# Patient Record
Sex: Female | Born: 1945 | Race: White | Hispanic: No | Smoking: Current every day smoker
Health system: Southern US, Community
[De-identification: ages and names within clinical notes are randomized; demographics above are authoritative.]

## PROBLEM LIST (undated history)

## (undated) DIAGNOSIS — F32A Depression, unspecified: Secondary | ICD-10-CM

## (undated) DIAGNOSIS — F329 Major depressive disorder, single episode, unspecified: Secondary | ICD-10-CM

## (undated) DIAGNOSIS — F419 Anxiety disorder, unspecified: Secondary | ICD-10-CM

## (undated) DIAGNOSIS — M810 Age-related osteoporosis without current pathological fracture: Secondary | ICD-10-CM

## (undated) HISTORY — PX: OTHER SURGICAL HISTORY: SHX169

## (undated) HISTORY — DX: Age-related osteoporosis without current pathological fracture: M81.0

## (undated) HISTORY — DX: Major depressive disorder, single episode, unspecified: F32.9

## (undated) HISTORY — PX: TUBAL LIGATION: SHX77

## (undated) HISTORY — DX: Anxiety disorder, unspecified: F41.9

## (undated) HISTORY — DX: Depression, unspecified: F32.A

---

## 2008-06-11 ENCOUNTER — Emergency Department (HOSPITAL_COMMUNITY): Admission: EM | Admit: 2008-06-11 | Discharge: 2008-06-12 | Payer: Self-pay | Admitting: Emergency Medicine

## 2011-04-06 ENCOUNTER — Emergency Department (HOSPITAL_COMMUNITY)
Admission: EM | Admit: 2011-04-06 | Discharge: 2011-04-06 | Disposition: A | Discharge status: Home / Self Care | Payer: 59 | Attending: Emergency Medicine | Admitting: Emergency Medicine

## 2011-04-06 ENCOUNTER — Emergency Department (HOSPITAL_COMMUNITY): Payer: 59

## 2011-04-06 DIAGNOSIS — S42213A Unspecified displaced fracture of surgical neck of unspecified humerus, initial encounter for closed fracture: Secondary | ICD-10-CM | POA: Insufficient documentation

## 2011-04-06 DIAGNOSIS — M25569 Pain in unspecified knee: Secondary | ICD-10-CM | POA: Insufficient documentation

## 2011-04-06 DIAGNOSIS — R110 Nausea: Secondary | ICD-10-CM | POA: Insufficient documentation

## 2011-04-06 DIAGNOSIS — Z87442 Personal history of urinary calculi: Secondary | ICD-10-CM | POA: Insufficient documentation

## 2011-04-06 DIAGNOSIS — M25519 Pain in unspecified shoulder: Secondary | ICD-10-CM | POA: Insufficient documentation

## 2011-04-06 DIAGNOSIS — W010XXA Fall on same level from slipping, tripping and stumbling without subsequent striking against object, initial encounter: Secondary | ICD-10-CM | POA: Insufficient documentation

## 2011-04-10 ENCOUNTER — Ambulatory Visit (HOSPITAL_COMMUNITY): Payer: 59

## 2011-04-10 ENCOUNTER — Observation Stay (HOSPITAL_COMMUNITY)
Admission: RE | Admit: 2011-04-10 | Discharge: 2011-04-11 | Disposition: A | Discharge status: Home / Self Care | Payer: 59 | Source: Ambulatory Visit | Attending: Orthopaedic Surgery | Admitting: Orthopaedic Surgery

## 2011-04-10 DIAGNOSIS — F3289 Other specified depressive episodes: Secondary | ICD-10-CM | POA: Insufficient documentation

## 2011-04-10 DIAGNOSIS — S42293A Other displaced fracture of upper end of unspecified humerus, initial encounter for closed fracture: Principal | ICD-10-CM | POA: Insufficient documentation

## 2011-04-10 DIAGNOSIS — W19XXXA Unspecified fall, initial encounter: Secondary | ICD-10-CM | POA: Insufficient documentation

## 2011-04-10 DIAGNOSIS — F329 Major depressive disorder, single episode, unspecified: Secondary | ICD-10-CM | POA: Insufficient documentation

## 2011-04-10 DIAGNOSIS — F17200 Nicotine dependence, unspecified, uncomplicated: Secondary | ICD-10-CM | POA: Insufficient documentation

## 2011-04-10 DIAGNOSIS — Z79899 Other long term (current) drug therapy: Secondary | ICD-10-CM | POA: Insufficient documentation

## 2011-04-10 DIAGNOSIS — Z01810 Encounter for preprocedural cardiovascular examination: Secondary | ICD-10-CM | POA: Insufficient documentation

## 2011-04-10 DIAGNOSIS — Z01812 Encounter for preprocedural laboratory examination: Secondary | ICD-10-CM | POA: Insufficient documentation

## 2011-04-10 LAB — CBC
Hemoglobin: 11.1 g/dL — ABNORMAL LOW (ref 12.0–15.0)
MCH: 31.8 pg (ref 26.0–34.0)
Platelets: 122 10*3/uL — ABNORMAL LOW (ref 150–400)
RBC: 3.49 MIL/uL — ABNORMAL LOW (ref 3.87–5.11)
WBC: 10.1 10*3/uL (ref 4.0–10.5)

## 2011-04-10 LAB — PROTIME-INR: INR: 1 (ref 0.00–1.49)

## 2011-04-14 NOTE — H&P (Signed)
  NAMEARYIAH, MONTEROSSO                 ACCOUNT NO.:  [id number]  MEDICAL RECORD NO.:  [id number]  LOCATION:  DAYL                         FACILITY:  [facility]  PHYSICIAN:  Vanita Panda. Magnus Ivan, M.D.DATE OF BIRTH:  04-30-1946  DATE OF ADMISSION:  04/10/2011 DATE OF DISCHARGE:                             HISTORY & PHYSICAL   CHIEF COMPLAINT:  Severe right shoulder pain.  HISTORY OF PRESENT ILLNESS:  Ms. Barbier is a 65 year old female who is right-hand dominant.  She is sustained mechanical fall this past Friday when she tripped over some bed sheets.  She sustained right three-part proximal humerus fracture.  This was diagnosed at [facility] I have seen her husband before, so they elevated to have her placed on a sling and follow up with me.  I saw her in the office yesterday.  The fracture was quite displaced and she somewhat is right-hand dominant and also takes care of elderly parents.  She wishes to proceed with open reduction and internal fixation of the injury and the fracture given the nature of the injury and the need to try to get better outcome and healing further.  The risks and benefits of this where explained to her in details.  PAST MEDICAL HISTORY:  Depression.  MEDICATIONS:  Endocet.  ALLERGIES:  No known drug allergies.  PREVIOUS SURGERY:  Tubal ligation in 1990.  FAMILY MEDICAL HISTORY:  Heart disease, breast cancer, ovarian cancer, and high blood pressure.  SOCIAL HISTORY:  She is married.  She is retired.  She does smoke a pack a day for the last 25 years.  She does not drink alcohol.  REVIEW OF SYSTEMS:  Negative for chest pain, shortness of breath, fever, chills, nausea, and vomiting.  PHYSICAL EXAMINATION:  VITAL SIGNS:  She is afebrile with stable vital signs. GENERAL:  She is alert and oriented x3, in no acute distress. HEENT:  Normocephalic, atraumatic.  Pupils equal, round, reactive to light. NECK:  Supple. LUNGS:  Clear to auscultation  bilaterally. HEART:  Regular rate and rhythm. ABDOMEN:  Benign. EXTREMITIES:  Right shoulder shows bruising, it is chronically located with distally.  She is neurovascularly intact and she has severe pain.  X-rays were reviewed and confirmed a complex right three-part proximal humerus fracture.  ASSESSMENT:  This is a 65 year old right hand dominant female with complex right proximal humerus fracture.  PLAN:  We are going to proceed to the operating room today for open reduction and internal fixation of her fracture and then admitted for overnight observation, pain control, and IV antibiotics.     Vanita Panda. Magnus Ivan, M.D.     CYB/MEDQ  D:  04/10/2011  T:  04/10/2011  Job:  [id number]  Electronically Signed by Doneen Poisson M.D. on 04/14/2011 11:46:27 AM

## 2011-04-14 NOTE — Discharge Summary (Signed)
  [name], [name]                 ACCOUNT NO.:  [id number]  MEDICAL RECORD NO.:  [id number]  LOCATION:  1618                         FACILITY:  [facility]  PHYSICIAN:  Vanita Panda. Magnus Ivan, M.D.DATE OF BIRTH:  1946-01-25  DATE OF ADMISSION:  04/10/2011 DATE OF DISCHARGE:  04/11/2011                              DISCHARGE SUMMARY   ADMITTING DIAGNOSIS:  Right shoulder four-part proximal humerus fracture.  DISCHARGE DIAGNOSIS:  Right shoulder four-part proximal humerus fracture.  PROCEDURE:  Open reduction and internal fixation of right complex four- part proximal humerus fracture on April 10, 2011.  HOSPITAL COURSE:  [name] is a 65 year old female who sustained a mechanical fall at the end of last week.  She sustained a four-part proximal humerus fracture.  She was taken to the operating room yesterday where she underwent open reduction and internal fixation of this complex fracture, which was quite difficult.  She was then admitted for overnight observation for pain control and IV antibiotics.  By postop day 1, she was certainly more comfortable and it was felt she could be discharged safely to home.  DISPOSITION:  Discharged to home.  DISCHARGE INSTRUCTIONS:  While she is at home, she can come out of her sling as comfort allows, but no overhead activities.  We will see her back in the office in 1 to 2 weeks.  She can get her incision wet, shower in 3 to 4 days as well.  DISCHARGE MEDICATIONS:  Percocet, Robaxin.     Vanita Panda. Magnus Ivan, M.D.     CYB/MEDQ  D:  04/11/2011  T:  04/11/2011  Job:  [id number]  Electronically Signed by Doneen Poisson M.D. on 04/14/2011 11:46:32 AM

## 2011-04-14 NOTE — Op Note (Signed)
[name], [name]                 ACCOUNT NO.:  [id number]  MEDICAL RECORD NO.:  [id number]  LOCATION:  1618                         FACILITY:  [facility]  PHYSICIAN:  Vanita Panda. Magnus Ivan, M.D.DATE OF BIRTH:  August 13, 1945  DATE OF PROCEDURE:  04/10/2011 DATE OF DISCHARGE:                              OPERATIVE REPORT   PREOPERATIVE DIAGNOSIS:  Right shoulder 3- to 4-part proximal humerus fracture.  POSTOPERATIVE DIAGNOSIS:  Right shoulder 4-part proximal humerus fracture.  PROCEDURE:  Open reduction and internal fixation of right shoulder proximal humerus fracture using periarticular lock and plate.  IMPLANTS:  [employer], formerly [employer], right proximal humerus lock and plate.  SURGEON:  Vanita Panda. Magnus Ivan, M.D.  ANESTHESIA: 1. General. 2. Local with 0.25% plain Sensorcaine.  BLOOD LOSS:  200 cc.  ANTIBIOTICS:  1 g IV Ancef.  COMPLICATIONS:  None.  INDICATION:  [name] is a 65 year old female, who sustained a mechanical fall recently.  She suffered a severe proximal humerus fracture subluxation in the shaft varus impact on the head.  She is right hand dominant and this is her shoulder that she does a lot of activities, especially with helping with parents who need her support and care.  We recommended she undergo an attempted open reduction internal fixation.  Given the nature of this fracture, she may end up with hemiarthroplasty at some point.  PROCEDURE DESCRIPTION:  After informed consent was obtained, the appropriate right shoulder was marked and she was brought to the operating room, placed supine on the operating table, general anesthesia was obtained.  She was then fashioned to a beach chair position with appropriate position of her head, neck, and padding down on nonoperative left arm.  Her right shoulder and arm were then prepped and draped with DuraPrep and sterile drapes including the sterile stockinette.  A time- out was called.  She was identified  as the correct patient and correct right shoulder.  I then performed a deltopectoral approach to the proximal humerus and was able to dissect down the fracture.  I found the fracture to be in multiple pieces and heavily comminuted.  I was able to get almost all fracture pieces back into alignment other than the femoral head being impacted and there is still being posterior rotation. There was a chunk of the head that I could not get that reduced.  I made a decision not to pursue with a hemiarthroplasty, but due to the major articular cartilage being intact, tried to get it as reduced as possible, placing a periarticular lock and plate.  I secured this plate on lateral cortex of the humerus and secured with locking pegs and threaded screws.  I put the shoulder to internal and external rotation and it was presently stable with no blocks to this rotation.  However, there was still a sizeable piece that I could not capture in the soft tissue.  I then copiously irrigated the tissues and closed the deep tissue with 0 Vicryl followed by 2-0 Vicryl in the subcutaneous tissue and staples on the skin.  Xeroform followed by well-padded sterile dressings were applied and the arm was placed back to sling.  She was awakened and extubated and  taken to the recovery room in stable condition.  All final counts were correct and there were no complications noted.     Vanita Panda. Magnus Ivan, M.D.     CYB/MEDQ  D:  04/10/2011  T:  04/11/2011  Job:  [id number]  Electronically Signed by Doneen Poisson M.D. on 04/14/2011 11:46:30 AM

## 2011-04-17 LAB — URINALYSIS, ROUTINE W REFLEX MICROSCOPIC
Nitrite: NEGATIVE
Urobilinogen, UA: 0.2
pH: 6

## 2011-04-17 LAB — POCT I-STAT, CHEM 8
BUN: 11
Hemoglobin: 13.6

## 2011-04-17 LAB — URINE MICROSCOPIC-ADD ON

## 2013-07-22 ENCOUNTER — Telehealth: Payer: Self-pay

## 2013-07-22 NOTE — Telephone Encounter (Signed)
Medication and allergies:  Reviewed and updated--no meds or allergies  90 day supply/mail order: na Local pharmacy: [facility] [address]   Immunizations due:  No records of any vaccines/had flu vaccine  A/P:   Added personal hx, FH, PSH as idicated (does not know of diseases in famly) No records. Has not been seen in "many, many, many years"  To Discuss with Provider: Advised patient to fast to obtain bass line labs

## 2013-07-23 ENCOUNTER — Encounter: Payer: Self-pay | Admitting: Internal Medicine

## 2013-07-23 ENCOUNTER — Ambulatory Visit (INDEPENDENT_AMBULATORY_CARE_PROVIDER_SITE_OTHER): Payer: Medicare Other | Admitting: Internal Medicine

## 2013-07-23 VITALS — BP 110/70 | HR 76 | Temp 97.8°F | Wt 133.0 lb

## 2013-07-23 DIAGNOSIS — Z13220 Encounter for screening for lipoid disorders: Secondary | ICD-10-CM

## 2013-07-23 DIAGNOSIS — Z23 Encounter for immunization: Secondary | ICD-10-CM

## 2013-07-23 DIAGNOSIS — Z136 Encounter for screening for cardiovascular disorders: Secondary | ICD-10-CM

## 2013-07-23 DIAGNOSIS — F419 Anxiety disorder, unspecified: Secondary | ICD-10-CM

## 2013-07-23 DIAGNOSIS — F329 Major depressive disorder, single episode, unspecified: Secondary | ICD-10-CM | POA: Insufficient documentation

## 2013-07-23 DIAGNOSIS — F32A Depression, unspecified: Secondary | ICD-10-CM | POA: Insufficient documentation

## 2013-07-23 DIAGNOSIS — Z0000 Encounter for general adult medical examination without abnormal findings: Secondary | ICD-10-CM | POA: Insufficient documentation

## 2013-07-23 DIAGNOSIS — F17200 Nicotine dependence, unspecified, uncomplicated: Secondary | ICD-10-CM

## 2013-07-23 DIAGNOSIS — Z130 Encounter for screening for diseases of the blood and blood-forming organs and certain disorders involving the immune mechanism: Secondary | ICD-10-CM

## 2013-07-23 DIAGNOSIS — F411 Generalized anxiety disorder: Secondary | ICD-10-CM

## 2013-07-23 HISTORY — DX: Anxiety disorder, unspecified: F41.9

## 2013-07-23 HISTORY — DX: Depression, unspecified: F32.A

## 2013-07-23 LAB — CBC WITH DIFFERENTIAL/PLATELET
BASOS ABS: 0 10*3/uL (ref 0.0–0.1)
Basophils Relative: 0.2 % (ref 0.0–3.0)
EOS ABS: 0.1 10*3/uL (ref 0.0–0.7)
EOS PCT: 1 % (ref 0.0–5.0)
HCT: 41.1 % (ref 36.0–46.0)
HEMOGLOBIN: 14 g/dL (ref 12.0–15.0)
LYMPHS PCT: 14 % (ref 12.0–46.0)
Lymphs Abs: 1.2 10*3/uL (ref 0.7–4.0)
MCHC: 34 g/dL (ref 30.0–36.0)
MCV: 93.2 fl (ref 78.0–100.0)
MONO ABS: 0.3 10*3/uL (ref 0.1–1.0)
MONOS PCT: 3.3 % (ref 3.0–12.0)
NEUTROS ABS: 6.9 10*3/uL (ref 1.4–7.7)
NEUTROS PCT: 81.5 % — AB (ref 43.0–77.0)
Platelets: 148 10*3/uL — ABNORMAL LOW (ref 150.0–400.0)
RBC: 4.42 Mil/uL (ref 3.87–5.11)
RDW: 12.8 % (ref 11.5–14.6)
WBC: 8.5 10*3/uL (ref 4.5–10.5)

## 2013-07-23 LAB — COMPREHENSIVE METABOLIC PANEL
ALT: 20 U/L (ref 0–35)
AST: 23 U/L (ref 0–37)
Albumin: 4.5 g/dL (ref 3.5–5.2)
Alkaline Phosphatase: 52 U/L (ref 39–117)
BUN: 11 mg/dL (ref 6–23)
CALCIUM: 9.4 mg/dL (ref 8.4–10.5)
CO2: 30 meq/L (ref 19–32)
Chloride: 105 mEq/L (ref 96–112)
Creatinine, Ser: 0.8 mg/dL (ref 0.4–1.2)
GFR: 80.89 mL/min (ref >60.00)
GLUCOSE: 90 mg/dL (ref 70–99)
POTASSIUM: 3.9 meq/L (ref 3.5–5.1)
Sodium: 143 mEq/L (ref 135–145)
Total Bilirubin: 0.7 mg/dL (ref 0.3–1.2)
Total Protein: 7.6 g/dL (ref 6.0–8.3)

## 2013-07-23 LAB — TSH: TSH: 0.93 u[IU]/mL (ref 0.35–5.50)

## 2013-07-23 LAB — LIPID PANEL
CHOL/HDL RATIO: 3
CHOLESTEROL: 211 mg/dL — AB (ref 0–200)
HDL: 73.9 mg/dL (ref >39.00)
TRIGLYCERIDES: 66 mg/dL (ref 0.0–149.0)
VLDL: 13.2 mg/dL (ref 0.0–40.0)

## 2013-07-23 LAB — LDL CHOLESTEROL, DIRECT: LDL DIRECT: 112.6 mg/dL

## 2013-07-23 NOTE — Patient Instructions (Signed)
  Get your blood work before you leave   Next visit is for a physical exam in 2 months , no fasting Please make an appointment     Please make an appointment with one of our counselors before you leave

## 2013-07-23 NOTE — Assessment & Plan Note (Signed)
Long history of anxiety manifested as fears and worring constantly . She did counseling many years ago and it helped significantly, has never taken medications. I recommend to restart counseling, reassess in 2 months and consider medication. She states that this feelings are significantly affecting her life; fortunately they're actually not getting worse if anything is slightly better --->  forcing herself to go out and do things

## 2013-07-23 NOTE — Assessment & Plan Note (Addendum)
The patient has not seen a doctor or having any tests since the 90s, although she is not doing a Medicare physical today we did the  following: Td today PNM shot today Had a flu shot Gynecology referral labs Refer for a mammogram Further eval on RTC  Had a Cscope for bleeding 1999 d/t rectal bleed, asx now

## 2013-07-23 NOTE — Progress Notes (Signed)
Pre visit review using our clinic review tool, if applicable. No additional management support is needed unless otherwise documented below in the visit note. 

## 2013-07-23 NOTE — Assessment & Plan Note (Signed)
Long-term smoker, no cough or shortness or breath. Risks including cancer, emphysema, CAD, strokes discussed Strategies to quit discussed as well, will call if/when ready  to take medications. rec to see her dentist

## 2013-07-23 NOTE — Progress Notes (Signed)
   Subjective:    Patient ID: [name], female    DOB: 04/04/1946, 68 y.o.   MRN: [id number]  HPI New patient, here with her husband who is one of  my patients, needs to get established, has not seen a physician or having any tests since the 90s.  Her main concern is anxiety, for the last 20 years he has developed this fear of doing things, fear when her husband is not at home, these feelings are limiting and affecting her lifestyle, she stopped driving in 8115. In the last few years she has fortunately not gotten worse actually she is forcing herself to go out and do things but by no means she feels comfortable. She denies depression per se.  She is a long-time smoker.  Past Medical History  Diagnosis Date  . Depression     h/o    Past Surgical History  Procedure Laterality Date  . Surgery      R arm and shoulder  . Tubal ligation     History   Social History  . Marital Status: Married    Spouse Name: N/A    Number of Children: 0  . Years of Education: N/A   Occupational History  . retired, catering     Social History Main Topics  . Smoking status: Current Every Day Smoker  . Smokeless tobacco: Never Used     Comment: 1 ppd started 1987  . Alcohol Use: No  . Drug Use: No  . Sexual Activity: Not on file   Other Topics Concern  . Not on file   Social History Narrative   [employer] education.   Household pt and husband    Family History  Problem Relation Age of Onset  . Breast cancer Mother     breast  . Colon cancer Neg Hx   . CAD Other     MI GF age 92  . Diabetes Father   . Ovarian cancer Other     GM    Review of Systems Denies chest pain, shortness of breath. No nausea, vomiting, diarrhea She sleeps okay, things in her life are okay, she does not have any real reason to be concerned about. Denies any cough, sputum production or wheezing.    Objective:   Physical Exam BP 110/70  Pulse 76  Temp(Src) 97.8 F (36.6 C)  Wt 133 lb (60.328  kg)  SpO2 98% General -- alert, well-developed, NAD.  Neck --  no LAD HEENT-- Not pale.  Lungs -- normal respiratory effort, no intercostal retractions, no accessory muscle use, and normal breath sounds.  Heart-- normal rate, regular rhythm, no murmur.  Abdomen-- Not distended, good bowel sounds,soft, non-tender. No rebound or rigidity. No mass,organomegaly. Extremities-- no pretibial edema bilaterally  Neurologic--  alert & oriented X3. Speech normal, gait normal, strength normal in all extremities.  Psych-- Cognition and judgment appear intact. Cooperative with normal attention span and concentration. No anxious or depressed appearing.      Assessment & Plan:  Today , I spent more than  30  min with the patient, >50% of the time counseling

## 2013-07-24 ENCOUNTER — Telehealth: Payer: Self-pay | Admitting: Internal Medicine

## 2013-07-24 DIAGNOSIS — Z1231 Encounter for screening mammogram for malignant neoplasm of breast: Secondary | ICD-10-CM

## 2013-07-24 NOTE — Telephone Encounter (Signed)
Relevant patient education assigned to patient using Emmi. ° °

## 2013-07-28 NOTE — Addendum Note (Signed)
Addended by: Peggyann Shoals on: 07/28/2013 01:26 PM   Modules accepted: Orders

## 2013-08-21 ENCOUNTER — Ambulatory Visit
Admission: RE | Admit: 2013-08-21 | Discharge: 2013-08-21 | Disposition: A | Discharge status: Home / Self Care | Payer: Medicare Other | Source: Ambulatory Visit | Attending: Internal Medicine | Admitting: Internal Medicine

## 2013-08-21 DIAGNOSIS — Z1231 Encounter for screening mammogram for malignant neoplasm of breast: Secondary | ICD-10-CM

## 2013-08-28 ENCOUNTER — Other Ambulatory Visit: Payer: Self-pay | Admitting: Internal Medicine

## 2013-08-28 DIAGNOSIS — R928 Other abnormal and inconclusive findings on diagnostic imaging of breast: Secondary | ICD-10-CM

## 2013-09-09 ENCOUNTER — Ambulatory Visit
Admission: RE | Admit: 2013-09-09 | Discharge: 2013-09-09 | Disposition: A | Discharge status: Home / Self Care | Payer: Medicare Other | Source: Ambulatory Visit | Attending: Internal Medicine | Admitting: Internal Medicine

## 2013-09-09 DIAGNOSIS — R928 Other abnormal and inconclusive findings on diagnostic imaging of breast: Secondary | ICD-10-CM

## 2013-09-18 ENCOUNTER — Telehealth: Payer: Self-pay

## 2013-09-18 NOTE — Telephone Encounter (Signed)
Patient had CPE at new patient appt

## 2013-09-18 NOTE — Telephone Encounter (Signed)
Original visit for Monday 3//2014 was scheduled as a CPE. Aug 03, 2022 visit was coded as a physical so this one is not consider a physical. Explain to patient and spouse that PCP still had things to go over but that this one could not be coded as a physical due to billing. PCP covered the majority of areas at last OV but needs to follow up on some current concerns. Spouse and wife states understanding.

## 2013-09-21 ENCOUNTER — Encounter: Payer: Medicare Other | Admitting: Internal Medicine

## 2013-09-21 ENCOUNTER — Ambulatory Visit (INDEPENDENT_AMBULATORY_CARE_PROVIDER_SITE_OTHER): Payer: Medicare Other | Admitting: Internal Medicine

## 2013-09-21 ENCOUNTER — Encounter: Payer: Self-pay | Admitting: Internal Medicine

## 2013-09-21 VITALS — BP 114/72 | HR 69 | Temp 98.5°F | Wt 134.0 lb

## 2013-09-21 DIAGNOSIS — Z0000 Encounter for general adult medical examination without abnormal findings: Secondary | ICD-10-CM

## 2013-09-21 DIAGNOSIS — F411 Generalized anxiety disorder: Secondary | ICD-10-CM

## 2013-09-21 DIAGNOSIS — F17200 Nicotine dependence, unspecified, uncomplicated: Secondary | ICD-10-CM

## 2013-09-21 MED ORDER — ZOSTER VACCINE LIVE 19400 UNT/0.65ML ~~LOC~~ SOLR: 0.6500 mL | Freq: Once | SUBCUTANEOUS | Status: DC

## 2013-09-21 MED ORDER — ESCITALOPRAM OXALATE 5 MG PO TABS: 5.0000 mg | ORAL_TABLET | Freq: Every day | ORAL | Status: DC

## 2013-09-21 NOTE — Assessment & Plan Note (Addendum)
Unchange,   recommend to see a dentist yearly

## 2013-09-21 NOTE — Progress Notes (Signed)
Pre visit review using our clinic review tool, if applicable. No additional management support is needed unless otherwise documented below in the visit note. 

## 2013-09-21 NOTE — Assessment & Plan Note (Addendum)
  Td 07-2013 PNM shot 07-2013 zostavax -- rx provided   saw Gynecology   Had a  Mammogram 2-15, next 1 year  Had a Cscope for bleeding 1999 d/t rectal bleed,  Discussed different modalities of colon cancer screening, elected Cologuard, will contact the company that performed the test. Schedule a Bone density test Palpable abdominal aorta, check a screening ultrasound

## 2013-09-21 NOTE — Patient Instructions (Signed)
Start Lexapro 5 mg one tablet daily Next visit is for routine check up regards lexapro  in 2 months  No need to come back fasting Please make an appointment     Plan:  Bone density test Shingles shot, see prescription Ultrasound of the abdomen     Fall Prevention and Home Safety Falls cause injuries and can affect all age groups. It is possible to use preventive measures to significantly decrease the likelihood of falls. There are many simple measures which can make your home safer and prevent falls. OUTDOORS  Repair cracks and edges of walkways and driveways.  Remove high doorway thresholds.  Trim shrubbery on the main path into your home.  Have good outside lighting.  Clear walkways of tools, rocks, debris, and clutter.  Check that handrails are not broken and are securely fastened. Both sides of steps should have handrails.  Have leaves, snow, and ice cleared regularly.  Use sand or salt on walkways during winter months.  In the garage, clean up grease or oil spills. BATHROOM  Install night lights.  Install grab bars by the toilet and in the tub and shower.  Use non-skid mats or decals in the tub or shower.  Place a plastic non-slip stool in the shower to sit on, if needed.  Keep floors dry and clean up all water on the floor immediately.  Remove soap buildup in the tub or shower on a regular basis.  Secure bath mats with non-slip, double-sided rug tape.  Remove throw rugs and tripping hazards from the floors. BEDROOMS  Install night lights.  Make sure a bedside light is easy to reach.  Do not use oversized bedding.  Keep a telephone by your bedside.  Have a firm chair with side arms to use for getting dressed.  Remove throw rugs and tripping hazards from the floor. KITCHEN  Keep handles on pots and pans turned toward the center of the stove. Use back burners when possible.  Clean up spills quickly and allow time for drying.  Avoid walking  on wet floors.  Avoid hot utensils and knives.  Position shelves so they are not too high or low.  Place commonly used objects within easy reach.  If necessary, use a sturdy step stool with a grab bar when reaching.  Keep electrical cables out of the way.  Do not use floor polish or wax that makes floors slippery. If you must use wax, use non-skid floor wax.  Remove throw rugs and tripping hazards from the floor. STAIRWAYS  Never leave objects on stairs.  Place handrails on both sides of stairways and use them. Fix any loose handrails. Make sure handrails on both sides of the stairways are as long as the stairs.  Check carpeting to make sure it is firmly attached along stairs. Make repairs to worn or loose carpet promptly.  Avoid placing throw rugs at the top or bottom of stairways, or properly secure the rug with carpet tape to prevent slippage. Get rid of throw rugs, if possible.  Have an electrician put in a light switch at the top and bottom of the stairs. OTHER FALL PREVENTION TIPS  Wear low-heel or rubber-soled shoes that are supportive and fit well. Wear closed toe shoes.  When using a stepladder, make sure it is fully opened and both spreaders are firmly locked. Do not climb a closed stepladder.  Add color or contrast paint or tape to grab bars and handrails in your home. Place contrasting color strips  on first and last steps.  Learn and use mobility aids as needed. Install an electrical emergency response system.  Turn on lights to avoid dark areas. Replace light bulbs that burn out immediately. Get light switches that glow.  Arrange furniture to create clear pathways. Keep furniture in the same place.  Firmly attach carpet with non-skid or double-sided tape.  Eliminate uneven floor surfaces.  Select a carpet pattern that does not visually hide the edge of steps.  Be aware of all pets. OTHER HOME SAFETY TIPS  Set the water temperature for 120 F (48.8  C).  Keep emergency numbers on or near the telephone.  Keep smoke detectors on every level of the home and near sleeping areas. Document Released: 06/22/2002 Document Revised: 01/01/2012 Document Reviewed: 09/21/2011 [facility] Patient Information 2014 [place].

## 2013-09-21 NOTE — Progress Notes (Signed)
Subjective:    Patient ID: [name], female    DOB: 1945-11-18, 68 y.o.   MRN: [id number]  DOS:  09/21/2013 Type of  visit: Here for Medicare AWV:  1. Risk factors based on Past M, S, F history: reviewed 2. Physical Activities:  Yard and house chores  3. Depression/mood: denies depression some  4. Hearing:  No problemss noted or reported  5. ADL's:  Independent, husband does the driving (d/t anxiety) 6. Fall Risk: no recent falls, counseled  7. home Safety: does feel safe at home  8. Height, weight, & visual acuity: see VS, had a episode of blurred vision few days ago, no amaurosis fugax, diplopia; rec to see the eye doctor 9. Counseling: provided 10. Labs ordered based on risk factors: if needed  11. Referral Coordination: if needed 12. Care Plan, see assessment and plan  13. Cognitive Assessment:Motor skills and cognition appropriate.  In addition, today we discussed the following: Anxiety--did not see a counselor, doing about the same.   ROS No  CP, SOB  Denies  nausea, vomiting diarrhea denies  blood in the stools (-) cough, sputum production (-) wheezing, chest congestion (-)hemoptysis No dysuria, gross hematuria, difficulty urinating   Past Medical History  Diagnosis Date  . Depression     h/o     Past Surgical History  Procedure Laterality Date  . Surgery      R arm and shoulder  . Tubal ligation      History   Social History  . Marital Status: Married    Spouse Name: N/A    Number of Children: 0  . Years of Education: N/A   Occupational History  . retired, catering     Social History Main Topics  . Smoking status: Current Every Day Smoker  . Smokeless tobacco: Never Used     Comment: 1 ppd started 1987  . Alcohol Use: No  . Drug Use: No  . Sexual Activity: Not on file   Other Topics Concern  . Not on file   Social History Narrative   [employer] education.   Household pt and husband      Family History  Problem Relation Age of  Onset  . Breast cancer Mother     breast  . Colon cancer Neg Hx   . CAD Other     MI GF age 21  . Diabetes Father   . Ovarian cancer Other     GM       Medication List       This list is accurate as of: 09/21/13  6:14 PM.  Always use your most recent med list.               escitalopram 5 MG tablet  Commonly known as:  LEXAPRO  Take 1 tablet (5 mg total) by mouth daily.     zoster vaccine live (PF) 19400 UNT/0.65ML injection  Commonly known as:  ZOSTAVAX  Inject 19,400 Units into the skin once.           Objective:   Physical Exam BP 114/72  Pulse 69  Temp(Src) 98.5 F (36.9 C)  Wt 134 lb (60.782 kg)  SpO2 99% General -- alert, well-developed, NAD.  Neck -- normal carotid pulse  HEENT-- Not pale.  Lungs -- normal respiratory effort, no intercostal retractions, no accessory muscle use, and normal breath sounds.  Heart-- normal rate, regular rhythm, no murmur.  Abdomen-- Not distended, good bowel sounds,soft, non-tender. Palpable  nontender aorta And the upper abdomen, no bruit. Extremities-- no pretibial edema bilaterally  Neurologic--  alert & oriented X3. Speech normal, gait normal, strength normal in all extremities.  Psych-- Cognition and judgment appear intact. Cooperative with normal attention span and concentration. No anxious or depressed appearing.      Assessment & Plan:

## 2013-09-21 NOTE — Assessment & Plan Note (Signed)
Doing about the same, has not seen a counselor, is not really interested in taking that route. Discussed SSRIs versus benzodiazepines, not interested in benzos. We agreed to start Lexapro 5 mg, low dose, s/e discussed . Followup 2 months

## 2013-09-24 ENCOUNTER — Ambulatory Visit (INDEPENDENT_AMBULATORY_CARE_PROVIDER_SITE_OTHER)
Admission: RE | Admit: 2013-09-24 | Discharge: 2013-09-24 | Disposition: A | Discharge status: Home / Self Care | Payer: Medicare Other | Source: Ambulatory Visit | Attending: Internal Medicine | Admitting: Internal Medicine

## 2013-09-24 DIAGNOSIS — Z0000 Encounter for general adult medical examination without abnormal findings: Secondary | ICD-10-CM

## 2013-09-24 DIAGNOSIS — Z13820 Encounter for screening for osteoporosis: Secondary | ICD-10-CM

## 2013-09-28 ENCOUNTER — Ambulatory Visit
Admission: RE | Admit: 2013-09-28 | Discharge: 2013-09-28 | Disposition: A | Discharge status: Home / Self Care | Payer: Medicare Other | Source: Ambulatory Visit | Attending: Internal Medicine | Admitting: Internal Medicine

## 2013-09-28 DIAGNOSIS — Z0000 Encounter for general adult medical examination without abnormal findings: Secondary | ICD-10-CM

## 2013-09-30 ENCOUNTER — Other Ambulatory Visit: Payer: Self-pay | Admitting: *Deleted

## 2013-09-30 DIAGNOSIS — M810 Age-related osteoporosis without current pathological fracture: Secondary | ICD-10-CM

## 2013-09-30 MED ORDER — ALENDRONATE SODIUM 70 MG PO TABS: 70.0000 mg | ORAL_TABLET | ORAL | Status: DC

## 2013-10-01 LAB — COLOGUARD: Cologuard: NEGATIVE

## 2013-10-07 LAB — COLOGUARD

## 2013-10-21 ENCOUNTER — Telehealth: Payer: Self-pay | Admitting: Internal Medicine

## 2013-10-21 NOTE — Telephone Encounter (Signed)
10/21/13  Pt had a cologuard test performed back in march and has not received the results back yet.

## 2013-10-22 NOTE — Telephone Encounter (Signed)
Cologuard rep contacted by Jeral Fruit patient that we will let her know as soon as we have an answer.

## 2013-10-23 NOTE — Telephone Encounter (Signed)
cologuard is resending the report. Advised patient.

## 2013-11-17 ENCOUNTER — Telehealth: Payer: Self-pay | Admitting: Internal Medicine

## 2013-11-17 NOTE — Telephone Encounter (Signed)
Caller name: Lazette Relation to pt: Call back number: [phone]   Reason for call:   Pt is calling to get results of Colonguard test.  Pt spoke with [employer] and they state that they have sent the results back in April.  Please contact pt.

## 2013-11-17 NOTE — Telephone Encounter (Signed)
[name] is contacting cologuard rep

## 2013-11-18 NOTE — Telephone Encounter (Signed)
Oris Drone, Cologuard rep has been mailed again in regards to this.

## 2013-11-19 NOTE — Telephone Encounter (Signed)
Closing not until we get the log in info

## 2013-11-23 ENCOUNTER — Telehealth: Payer: Self-pay

## 2013-11-23 ENCOUNTER — Encounter: Payer: Self-pay | Admitting: Internal Medicine

## 2013-11-23 ENCOUNTER — Ambulatory Visit (INDEPENDENT_AMBULATORY_CARE_PROVIDER_SITE_OTHER): Payer: Medicare Other | Admitting: Internal Medicine

## 2013-11-23 VITALS — BP 107/61 | HR 65 | Temp 98.7°F | Wt 129.8 lb

## 2013-11-23 DIAGNOSIS — F411 Generalized anxiety disorder: Secondary | ICD-10-CM

## 2013-11-23 DIAGNOSIS — Z0000 Encounter for general adult medical examination without abnormal findings: Secondary | ICD-10-CM

## 2013-11-23 MED ORDER — ESCITALOPRAM OXALATE 10 MG PO TABS: 10.0000 mg | ORAL_TABLET | Freq: Every day | ORAL | Status: DC

## 2013-11-23 NOTE — Assessment & Plan Note (Signed)
See  previous entries, Lexapro helping a lot with sleeping  "best sleep I ever had" however the fear of ryding a car has not improved. Plan: Increase Lexapro to 10 mg daily, if not improving let me know, followup 6 months

## 2013-11-23 NOTE — Telephone Encounter (Addendum)
Patient presents for her Office Visit today stating that she has not received the results to the Cologuard test.  Results located and given to Dr Larose Kells to review at visit.

## 2013-11-23 NOTE — Patient Instructions (Addendum)
Increase Lexapro to 10 mg every night. If you are not better in 2 months please call us Next visit in 6 months

## 2013-11-23 NOTE — Progress Notes (Signed)
Pre visit review using our clinic review tool, if applicable. No additional management support is needed unless otherwise documented below in the visit note. 

## 2013-11-23 NOTE — Assessment & Plan Note (Signed)
cologuard-- negative, patient aware. We also briefly discussed her recent labs, all normal. Complained of constipation with calcium supplementation, recommend   to stay only on  vitamin D.

## 2013-11-23 NOTE — Progress Notes (Signed)
   Subjective:    Patient ID: [name], female    DOB: [birth year], 68 y.o.   MRN: [id number]  DOS:  11/23/2013 Type of  visit: F/u from previous visit Taking Lexapro, sleeping very well, it however has not helped her anxiety ryding a car.    ROS Denies nausea, vomiting, diarrhea or blood in the stools. Some constipation, thinks related to calcium supplements  Past Medical History  Diagnosis Date  . Depression     h/o     Past Surgical History  Procedure Laterality Date  . Surgery      R arm and shoulder  . Tubal ligation      History   Social History  . Marital Status: Married    Spouse Name: N/A    Number of Children: 0  . Years of Education: N/A   Occupational History  . retired, catering     Social History Main Topics  . Smoking status: Current Every Day Smoker  . Smokeless tobacco: Never Used     Comment: 1 ppd started 1987  . Alcohol Use: No  . Drug Use: No  . Sexual Activity: Not on file   Other Topics Concern  . Not on file   Social History Narrative   [employer] education.   Household pt and husband         Medication List       This list is accurate as of: 11/23/13  7:11 PM.  Always use your most recent med list.               alendronate 70 MG tablet  Commonly known as:  FOSAMAX  Take 1 tablet (70 mg total) by mouth once a week. Take with a full glass of water on an empty stomach.     CALCIUM 500+D HIGH POTENCY 500-400 MG-UNIT per tablet  Generic drug:  calcium-vitamin D  Take 1 tablet by mouth daily.     cholecalciferol 1000 UNITS tablet  Commonly known as:  VITAMIN D  Take 1,000 Units by mouth once a week.     escitalopram 10 MG tablet  Commonly known as:  LEXAPRO  Take 1 tablet (10 mg total) by mouth daily.     OVER THE COUNTER MEDICATION  Stool softner 2 gel capsules daily.           Objective:   Physical Exam BP 107/61  Pulse 65  Temp(Src) 98.7 F (37.1 C) (Oral)  Wt 129 lb 12.8 oz (58.877 kg)  SpO2  98% General -- alert, well-developed, NAD.   Psych-- Cognition and judgment appear intact. Cooperative with normal attention span and concentration. No anxious or depressed appearing. \      Assessment & Plan:   Today , I spent more than 15  min with the patient, >50% of the time counseling  and discussing the pros and cons of increase Lexapro

## 2014-03-07 ENCOUNTER — Other Ambulatory Visit: Payer: Self-pay | Admitting: Internal Medicine

## 2014-05-27 ENCOUNTER — Other Ambulatory Visit: Payer: Self-pay

## 2014-05-31 ENCOUNTER — Ambulatory Visit (INDEPENDENT_AMBULATORY_CARE_PROVIDER_SITE_OTHER): Payer: Medicare Other | Admitting: Internal Medicine

## 2014-05-31 ENCOUNTER — Encounter: Payer: Self-pay | Admitting: Internal Medicine

## 2014-05-31 VITALS — BP 110/67 | HR 70 | Temp 98.2°F | Wt 124.1 lb

## 2014-05-31 DIAGNOSIS — F419 Anxiety disorder, unspecified: Secondary | ICD-10-CM

## 2014-05-31 DIAGNOSIS — M810 Age-related osteoporosis without current pathological fracture: Secondary | ICD-10-CM | POA: Insufficient documentation

## 2014-05-31 MED ORDER — ALENDRONATE SODIUM 70 MG PO TABS: ORAL_TABLET | ORAL | Status: DC

## 2014-05-31 MED ORDER — ESCITALOPRAM OXALATE 10 MG PO TABS: 10.0000 mg | ORAL_TABLET | Freq: Every day | ORAL | Status: DC

## 2014-05-31 NOTE — Patient Instructions (Signed)
Please come back to the office by march 2016  for a physical exam. Come back fasting

## 2014-05-31 NOTE — Assessment & Plan Note (Signed)
Since the last time, she increased Lexapro to 10 mg, symptoms are better controlled, she is able to ride a car almost without any problems although still getting anxious sometimes. She continued to sleep well, sometimes "too much" however in general is doing well and we agree not to change anything . Recommend to take Lexapro at 6 PM to minimize excessive somnolence during the daytime

## 2014-05-31 NOTE — Assessment & Plan Note (Addendum)
Bone density test March 2015 show a T score of -2.9, current treatment is Fosamax, vitamin D. Intolerant to Ca+ due to constipation Refill medications. Encourage physical activity

## 2014-05-31 NOTE — Progress Notes (Signed)
Pre visit review using our clinic review tool, if applicable. No additional management support is needed unless otherwise documented below in the visit note. 

## 2014-05-31 NOTE — Progress Notes (Signed)
   Subjective:    Patient ID: [name], female    DOB: February 16, 1946, 68 y.o.   MRN: [id number]  DOS:  05/31/2014 Type of visit - description : rov Interval history: Anxiety, we increase Lexapro dose, doing better. See assessment and plan Osteoporosis per bone density test in March, on Fosamax. Good compliance. See assessment and plan   ROS Denies depression No dysphagia, odynophagia. Taking vitamin D, very active with no routine exercise  Past Medical History  Diagnosis Date  . Depression     h/o     Past Surgical History  Procedure Laterality Date  . Surgery      R arm and shoulder  . Tubal ligation      History   Social History  . Marital Status: Married    Spouse Name: N/A    Number of Children: 0  . Years of Education: N/A   Occupational History  . retired, catering     Social History Main Topics  . Smoking status: Current Every Day Smoker  . Smokeless tobacco: Never Used     Comment: 1 ppd started 1987  . Alcohol Use: No  . Drug Use: No  . Sexual Activity: Not on file   Other Topics Concern  . Not on file   Social History Narrative   [employer] education.   Household pt and husband         Medication List       This list is accurate as of: 05/31/14  5:29 PM.  Always use your most recent med list.               alendronate 70 MG tablet  Commonly known as:  FOSAMAX  TAKE 1 TABLET BY MOUTH ONCE WEEKLY *TAKE W/GLASS OF WATER ON EMPTY STOMACH     CALCIUM 500+D HIGH POTENCY 500-400 MG-UNIT per tablet  Generic drug:  calcium-vitamin D  Take 1 tablet by mouth daily.     cholecalciferol 1000 UNITS tablet  Commonly known as:  VITAMIN D  Take 1,000 Units by mouth daily.     escitalopram 10 MG tablet  Commonly known as:  LEXAPRO  Take 1 tablet (10 mg total) by mouth daily.     STOOL SOFTENER PO  Take 2 capsules by mouth daily.           Objective:   Physical Exam BP 110/67 mmHg  Pulse 70  Temp(Src) 98.2 F (36.8 C) (Oral)   Wt 124 lb 2 oz (56.303 kg)  SpO2 98% General -- alert, well-developed, NAD.   Lungs -- normal respiratory effort, no intercostal retractions, no accessory muscle use, and normal breath sounds.  Heart-- normal rate, regular rhythm, no murmur.   Extremities-- no pretibial edema bilaterally  Neurologic--  alert & oriented X3. Speech normal, gait appropriate for age, strength symmetric and appropriate for age.  Psych-- Cognition and judgment appear intact. Cooperative with normal attention span and concentration. No anxious or depressed appearing.        Assessment & Plan:

## 2014-06-01 ENCOUNTER — Telehealth: Payer: Self-pay | Admitting: Internal Medicine

## 2014-06-01 MED ORDER — ALENDRONATE SODIUM 70 MG PO TABS: ORAL_TABLET | ORAL | Status: DC

## 2014-06-01 MED ORDER — ESCITALOPRAM OXALATE 10 MG PO TABS: 10.0000 mg | ORAL_TABLET | Freq: Every day | ORAL | Status: DC

## 2014-06-01 NOTE — Telephone Encounter (Signed)
One month supply sent to [facility] pharmacy of Fosamax and Lexapro as requested.

## 2014-06-01 NOTE — Telephone Encounter (Signed)
Caller name: Echeverri,Edgar  Relation to pt: spouse  Call back number: [phone] Pharmacy: [facility] [address], [place], [place] [address] [phone]   Reason for call:   Will not receive rx from mail order until Dec 7th pt seeking a month supply please send to retail to hold her over until medication is received.  escitalopram (LEXAPRO) 10 MG tablet & alendronate (FOSAMAX) 70 MG tablet

## 2014-06-04 ENCOUNTER — Other Ambulatory Visit: Payer: Self-pay

## 2014-06-04 MED ORDER — ESCITALOPRAM OXALATE 10 MG PO TABS: 10.0000 mg | ORAL_TABLET | Freq: Every day | ORAL | Status: DC

## 2014-06-04 MED ORDER — ALENDRONATE SODIUM 70 MG PO TABS: ORAL_TABLET | ORAL | Status: DC

## 2014-09-23 ENCOUNTER — Telehealth: Payer: Self-pay | Admitting: *Deleted

## 2014-09-23 ENCOUNTER — Encounter: Payer: Self-pay | Admitting: *Deleted

## 2014-09-23 NOTE — Telephone Encounter (Signed)
Pre-Visit Call completed with patient and chart updated.   Pre-Visit Info documented in Specialty Comments under [employer].    

## 2014-09-24 ENCOUNTER — Encounter: Payer: Self-pay | Admitting: Internal Medicine

## 2014-09-24 ENCOUNTER — Ambulatory Visit (INDEPENDENT_AMBULATORY_CARE_PROVIDER_SITE_OTHER): Payer: Medicare Other | Admitting: Internal Medicine

## 2014-09-24 VITALS — BP 112/58 | HR 68 | Temp 98.2°F | Ht 64.0 in | Wt 122.4 lb

## 2014-09-24 DIAGNOSIS — Z0000 Encounter for general adult medical examination without abnormal findings: Secondary | ICD-10-CM

## 2014-09-24 DIAGNOSIS — Z23 Encounter for immunization: Secondary | ICD-10-CM | POA: Diagnosis not present

## 2014-09-24 DIAGNOSIS — F17200 Nicotine dependence, unspecified, uncomplicated: Secondary | ICD-10-CM

## 2014-09-24 DIAGNOSIS — M810 Age-related osteoporosis without current pathological fracture: Secondary | ICD-10-CM

## 2014-09-24 DIAGNOSIS — F329 Major depressive disorder, single episode, unspecified: Secondary | ICD-10-CM

## 2014-09-24 DIAGNOSIS — F419 Anxiety disorder, unspecified: Secondary | ICD-10-CM

## 2014-09-24 DIAGNOSIS — F32A Depression, unspecified: Secondary | ICD-10-CM

## 2014-09-24 DIAGNOSIS — E785 Hyperlipidemia, unspecified: Secondary | ICD-10-CM

## 2014-09-24 LAB — LIPID PANEL
CHOLESTEROL: 178 mg/dL (ref 0–200)
HDL: 65.4 mg/dL (ref >39.00)
LDL Cholesterol: 93 mg/dL (ref 0–99)
NonHDL: 112.6
TRIGLYCERIDES: 98 mg/dL (ref 0.0–149.0)
Total CHOL/HDL Ratio: 3
VLDL: 19.6 mg/dL (ref 0.0–40.0)

## 2014-09-24 NOTE — Assessment & Plan Note (Addendum)
Td 07-2013 PNM shot 07-2013 prevnar----- today zostavax --09-2013  Pap-- done with Dr. Benjie Karvonen in 2015, per patient normal (would like to stop getting paps yearly) MMG-- 09/09/13 -- Neg   Had a Cscope for bleeding 1999 d/t rectal bleed,cologuard neg 2015   Palpable abdominal aorta, neg  Ultrasound 2015  Diet and exercise discussed Mild dyslipidemia with a total cholesterol of 211, check labs

## 2014-09-24 NOTE — Assessment & Plan Note (Signed)
Symptoms well-controlled, no change

## 2014-09-24 NOTE — Progress Notes (Signed)
Pre visit review using our clinic review tool, if applicable. No additional management support is needed unless otherwise documented below in the visit note. 

## 2014-09-24 NOTE — Progress Notes (Signed)
Subjective:    Patient ID: [name], female    DOB: [birth year], 69 y.o.   MRN: [id number]  DOS:  09/24/2014 Type of visit - description :   Here for Medicare AWV: 1. Risk factors based on Past M, S, F history: reviewed 2. Physical Activities: Yard and house chores , no routine exercise  3. Depression/mood: denies depression some  4. Hearing: No problemss noted or reported  5. ADL's: Independent, husband does the driving (d/t anxiety) 6. Fall Risk: no recent falls, counseled  7. home Safety: does feel safe at home  8. Height, weight, & visual acuity: see VS, vision ok, last eye doctor < 1 year 9. Counseling: provided 10. Labs ordered based on risk factors: if needed  11. Referral Coordination: if needed 12. Care Plan, see assessment and plan  13. Cognitive Assessment: Motor skills and cognition appropriate. 13.  Care team updated 15. End-of-life care discussed  In addition, today we discussed the following:  Osteoporosis, good compliance with Fosamax without apparent side effects Still smoking, no  plans to quit so far. On Lexapro, good compliance and no apparent side effects  Review of Systems Constitutional: No fever, chills. No unexplained wt changes. No unusual sweats HEENT: No dental problems, ear discharge, facial swelling, voice changes. No eye discharge, redness or intolerance to light Respiratory: No wheezing or difficulty breathing. No cough , mucus production Cardiovascular: No CP, leg swelling or palpitations GI: no nausea, vomiting, diarrhea or abdominal pain.  No blood in the stools. No dysphagia   Endocrine: No polyphagia, polyuria or polydipsia GU: No dysuria, gross hematuria, difficulty urinating. No urinary urgency or frequency. Musculoskeletal: No joint swellings or unusual aches or pains Skin: No change in the color of the skin, palor or rash Allergic, immunologic: No environmental allergies or food allergies Neurological: No dizziness or  syncope. No headaches. No diplopia, slurred speech, motor deficits, facial numbness Hematological: No enlarged lymph nodes, easy bruising or bleeding Psychiatry: No suicidal ideas, hallucinations, behavior problems or confusion. No unusual/severe anxiety or depression.     Past Medical History  Diagnosis Date  . Depression     h/o   . Anxiety and depression 07/23/2013    Past Surgical History  Procedure Laterality Date  . Surgery      R arm and shoulder  . Tubal ligation      History   Social History  . Marital Status: Married    Spouse Name: N/A  . Number of Children: 0  . Years of Education: N/A   Occupational History  . retired, catering     Social History Main Topics  . Smoking status: Current Every Day Smoker  . Smokeless tobacco: Never Used     Comment: 1 ppd started 1987  . Alcohol Use: No  . Drug Use: No  . Sexual Activity: Not on file   Other Topics Concern  . Not on file   Social History Narrative   [employer] education.   Household pt and husband . No other family      Family History  Problem Relation Age of Onset  . Breast cancer Mother     breast  . Colon cancer Neg Hx   . CAD Other     MI GF age 25  . Diabetes Father   . Ovarian cancer Other     GM  . Dementia Mother        Medication List       This list is  accurate as of: 09/24/14 11:59 PM.  Always use your most recent med list.               alendronate 70 MG tablet  Commonly known as:  FOSAMAX  TAKE 1 TABLET BY MOUTH ONCE WEEKLY *TAKE W/GLASS OF WATER ON EMPTY STOMACH     CALCIUM 500+D HIGH POTENCY 500-400 MG-UNIT per tablet  Generic drug:  calcium-vitamin D  Take 1 tablet by mouth daily.     cholecalciferol 1000 UNITS tablet  Commonly known as:  VITAMIN D  Take 1,000 Units by mouth daily.     escitalopram 10 MG tablet  Commonly known as:  LEXAPRO  Take 1 tablet (10 mg total) by mouth daily.     STOOL SOFTENER PO  Take 2 capsules by mouth daily.             Objective:   Physical Exam BP 112/58 mmHg  Pulse 68  Temp(Src) 98.2 F (36.8 C) (Oral)  Ht 5\' 4"  (1.626 m)  Wt 122 lb 6 oz (55.509 kg)  BMI 21.00 kg/m2  SpO2 98% General:   Well developed, well nourished . NAD.  Neck:  Full range of motion. Supple. No  thyromegaly , normal carotid pulse HEENT:  Normocephalic . Face symmetric, atraumatic Lungs:  CTA B Normal respiratory effort, no intercostal retractions, no accessory muscle use. Heart: RRR,  no murmur.  Abdomen:  Not distended, soft, non-tender. No rebound or rigidity. No mass, palpable, nontender aorta. Muscle skeletal: no pretibial edema bilaterally  Skin: Exposed areas without rash. Not pale. Not jaundice Neurologic:  alert & oriented X3.  Speech normal, gait appropriate for age and unassisted Strength symmetric and appropriate for age.  Psych: Cognition and judgment appear intact.  Cooperative with normal attention span and concentration.  Behavior appropriate. No anxious or depressed appearing.      Assessment & Plan:

## 2014-09-24 NOTE — Assessment & Plan Note (Addendum)
Counseled although she does not seem to be ready to quit

## 2014-09-24 NOTE — Assessment & Plan Note (Signed)
09-2013 T score -2.9, on Fosamax and vitamin D, intolerant to calcium due to constipation. Plan: Recheck a bone density test 2017.

## 2014-09-24 NOTE — Patient Instructions (Signed)
Get your blood work before you leave   Call for refills when needed    Come back to the office in 1 year  for a physical exam  Please schedule an appointment at the front desk    Come back fasting     Smoking Cessation Quitting smoking is important to your health and has many advantages. However, it is not always easy to quit since nicotine is a very addictive drug. Oftentimes, people try 3 times or more before being able to quit. This document explains the best ways for you to prepare to quit smoking. Quitting takes hard work and a lot of effort, but you can do it. ADVANTAGES OF QUITTING SMOKING  You will live longer, feel better, and live better.  Your body will feel the impact of quitting smoking almost immediately.  Within 20 minutes, blood pressure decreases. Your pulse returns to its normal level.  After 8 hours, carbon monoxide levels in the blood return to normal. Your oxygen level increases.  After 24 hours, the chance of having a heart attack starts to decrease. Your breath, hair, and body stop smelling like smoke.  After 48 hours, damaged nerve endings begin to recover. Your sense of taste and smell improve.  After 72 hours, the body is virtually free of nicotine. Your bronchial tubes relax and breathing becomes easier.  After 2 to 12 weeks, lungs can hold more air. Exercise becomes easier and circulation improves.  The risk of having a heart attack, stroke, cancer, or lung disease is greatly reduced.  After 1 year, the risk of coronary heart disease is cut in half.  After 5 years, the risk of stroke falls to the same as a nonsmoker.  After 10 years, the risk of lung cancer is cut in half and the risk of other cancers decreases significantly.  After 15 years, the risk of coronary heart disease drops, usually to the level of a nonsmoker.  If you are pregnant, quitting smoking will improve your chances of having a healthy baby.  The people you live with, especially  any children, will be healthier.  You will have extra money to spend on things other than cigarettes. QUESTIONS TO THINK ABOUT BEFORE ATTEMPTING TO QUIT You may want to talk about your answers with your health care provider.  Why do you want to quit?  If you tried to quit in the past, what helped and what did not?  What will be the most difficult situations for you after you quit? How will you plan to handle them?  Who can help you through the tough times? Your family? Friends? A health care provider?  What pleasures do you get from smoking? What ways can you still get pleasure if you quit? Here are some questions to ask your health care provider:  How can you help me to be successful at quitting?  What medicine do you think would be best for me and how should I take it?  What should I do if I need more help?  What is smoking withdrawal like? How can I get information on withdrawal? GET READY  Set a quit date.  Change your environment by getting rid of all cigarettes, ashtrays, matches, and lighters in your home, car, or work. Do not let people smoke in your home.  Review your past attempts to quit. Think about what worked and what did not. GET SUPPORT AND ENCOURAGEMENT You have a better chance of being successful if you have help. You  can get support in many ways.  Tell your family, friends, and coworkers that you are going to quit and need their support. Ask them not to smoke around you.  Get individual, group, or telephone counseling and support. Programs are available at [employer] and health centers. Call your local health department for information about programs in your area.  Spiritual beliefs and practices may help some smokers quit.  Download a "quit meter" on your computer to keep track of quit statistics, such as how long you have gone without smoking, cigarettes not smoked, and money saved.  Get a self-help book about quitting smoking and staying off  tobacco. [place] yourself from urges to smoke. Talk to someone, go for a walk, or occupy your time with a task.  Change your normal routine. Take a different route to work. Drink tea instead of coffee. Eat breakfast in a different place.  Reduce your stress. Take a hot bath, exercise, or read a book.  Plan something enjoyable to do every day. Reward yourself for not smoking.  Explore interactive web-based programs that specialize in helping you quit. GET MEDICINE AND USE IT CORRECTLY Medicines can help you stop smoking and decrease the urge to smoke. Combining medicine with the above behavioral methods and support can greatly increase your chances of successfully quitting smoking.  Nicotine replacement therapy helps deliver nicotine to your body without the negative effects and risks of smoking. Nicotine replacement therapy includes nicotine gum, lozenges, inhalers, nasal sprays, and skin patches. Some may be available over-the-counter and others require a prescription.  Antidepressant medicine helps people abstain from smoking, but how this works is unknown. This medicine is available by prescription.  Nicotinic receptor partial agonist medicine simulates the effect of nicotine in your brain. This medicine is available by prescription. Ask your health care provider for advice about which medicines to use and how to use them based on your health history. Your health care provider will tell you what side effects to look out for if you choose to be on a medicine or therapy. Carefully read the information on the package. Do not use any other product containing nicotine while using a nicotine replacement product.  RELAPSE OR DIFFICULT SITUATIONS Most relapses occur within the first 3 months after quitting. Do not be discouraged if you start smoking again. Remember, most people try several times before finally quitting. You may have symptoms of withdrawal because  your body is used to nicotine. You may crave cigarettes, be irritable, feel very hungry, cough often, get headaches, or have difficulty concentrating. The withdrawal symptoms are only temporary. They are strongest when you first quit, but they will go away within 10-14 days. To reduce the chances of relapse, try to:  Avoid drinking alcohol. Drinking lowers your chances of successfully quitting.  Reduce the amount of caffeine you consume. Once you quit smoking, the amount of caffeine in your body increases and can give you symptoms, such as a rapid heartbeat, sweating, and anxiety.  Avoid smokers because they can make you want to smoke.  Do not let weight gain distract you. Many smokers will gain weight when they quit, usually less than 10 pounds. Eat a healthy diet and stay active. You can always lose the weight gained after you quit.  Find ways to improve your mood other than smoking. FOR MORE INFORMATION  www.smokefree.gov  Document Released: 06/26/2001 Document Revised: 11/16/2013 Document Reviewed: 10/11/2011 [facility] Patient Information 2015 [place], [place]. This information  is not intended to replace advice given to you by your health care provider. Make sure you discuss any questions you have with your health care provider.     Fall Prevention and Home Safety Falls cause injuries and can affect all age groups. It is possible to use preventive measures to significantly decrease the likelihood of falls. There are many simple measures which can make your home safer and prevent falls. OUTDOORS  Repair cracks and edges of walkways and driveways.  Remove high doorway thresholds.  Trim shrubbery on the main path into your home.  Have good outside lighting.  Clear walkways of tools, rocks, debris, and clutter.  Check that handrails are not broken and are securely fastened. Both sides of steps should have handrails.  Have leaves, snow, and ice cleared regularly.  Use sand or salt  on walkways during winter months.  In the garage, clean up grease or oil spills. BATHROOM  Install night lights.  Install grab bars by the toilet and in the tub and shower.  Use non-skid mats or decals in the tub or shower.  Place a plastic non-slip stool in the shower to sit on, if needed.  Keep floors dry and clean up all water on the floor immediately.  Remove soap buildup in the tub or shower on a regular basis.  Secure bath mats with non-slip, double-sided rug tape.  Remove throw rugs and tripping hazards from the floors. BEDROOMS  Install night lights.  Make sure a bedside light is easy to reach.  Do not use oversized bedding.  Keep a telephone by your bedside.  Have a firm chair with side arms to use for getting dressed.  Remove throw rugs and tripping hazards from the floor. KITCHEN  Keep handles on pots and pans turned toward the center of the stove. Use back burners when possible.  Clean up spills quickly and allow time for drying.  Avoid walking on wet floors.  Avoid hot utensils and knives.  Position shelves so they are not too high or low.  Place commonly used objects within easy reach.  If necessary, use a sturdy step stool with a grab bar when reaching.  Keep electrical cables out of the way.  Do not use floor polish or wax that makes floors slippery. If you must use wax, use non-skid floor wax.  Remove throw rugs and tripping hazards from the floor. STAIRWAYS  Never leave objects on stairs.  Place handrails on both sides of stairways and use them. Fix any loose handrails. Make sure handrails on both sides of the stairways are as long as the stairs.  Check carpeting to make sure it is firmly attached along stairs. Make repairs to worn or loose carpet promptly.  Avoid placing throw rugs at the top or bottom of stairways, or properly secure the rug with carpet tape to prevent slippage. Get rid of throw rugs, if possible.  Have an  electrician put in a light switch at the top and bottom of the stairs. OTHER FALL PREVENTION TIPS  Wear low-heel or rubber-soled shoes that are supportive and fit well. Wear closed toe shoes.  When using a stepladder, make sure it is fully opened and both spreaders are firmly locked. Do not climb a closed stepladder.  Add color or contrast paint or tape to grab bars and handrails in your home. Place contrasting color strips on first and last steps.  Learn and use mobility aids as needed. Install an electrical emergency response system.  Turn on lights to avoid dark areas. Replace light bulbs that burn out immediately. Get light switches that glow.  Arrange furniture to create clear pathways. Keep furniture in the same place.  Firmly attach carpet with non-skid or double-sided tape.  Eliminate uneven floor surfaces.  Select a carpet pattern that does not visually hide the edge of steps.  Be aware of all pets. OTHER HOME SAFETY TIPS  Set the water temperature for 120 F (48.8 C).  Keep emergency numbers on or near the telephone.  Keep smoke detectors on every level of the home and near sleeping areas. Document Released: 06/22/2002 Document Revised: 01/01/2012 Document Reviewed: 09/21/2011 [facility] Patient Information 2015 [place], [place]. This information is not intended to replace advice given to you by your health care provider. Make sure you discuss any questions you have with your health care provider.   Preventive Care for Adults Ages 40 and over  Blood pressure check.** / Every 1 to 2 years.  Lipid and cholesterol check.**/ Every 5 years beginning at age 34.  Lung cancer screening. / Every year if you are aged 66-80 years and have a 30-pack-year history of smoking and currently smoke or have quit within the past 15 years. Yearly screening is stopped once you have quit smoking for at least 15 years or develop a health problem that would prevent you from having lung cancer  treatment.  Fecal occult blood test (FOBT) of stool. / Every year beginning at age 62 and continuing until age 30. You may not have to do this test if you get a colonoscopy every 10 years.  Flexible sigmoidoscopy** or colonoscopy.** / Every 5 years for a flexible sigmoidoscopy or every 10 years for a colonoscopy beginning at age 77 and continuing until age 88.  Hepatitis C blood test.** / For all people born from [birth year] through 1964 and any individual with known risks for hepatitis C.  Abdominal aortic aneurysm (AAA) screening.** / A one-time screening for ages 18 to 66 years who are current or former smokers.  Skin self-exam. / Monthly.  Influenza vaccine. / Every year.  Tetanus, diphtheria, and acellular pertussis (Tdap/Td) vaccine.** / 1 dose of Td every 10 years.  Varicella vaccine.** / Consult your health care provider.  Zoster vaccine.** / 1 dose for adults aged 55 years or older.  Pneumococcal 13-valent conjugate (PCV13) vaccine.** / Consult your health care provider.  Pneumococcal polysaccharide (PPSV23) vaccine.** / 1 dose for all adults aged 23 years and older.  Meningococcal vaccine.** / Consult your health care provider.  Hepatitis A vaccine.** / Consult your health care provider.  Hepatitis B vaccine.** / Consult your health care provider.  Haemophilus influenzae type b (Hib) vaccine.** / Consult your health care provider. **Family history and personal history of risk and conditions may change your health care provider's recommendations. Document Released: 08/28/2001 Document Revised: 07/07/2013 Document Reviewed: 11/27/2010 [facility] Patient Information 2015 [place], [place]. This information is not intended to replace advice given to you by your health care provider. Make sure you discuss any questions you have with your health care provider.

## 2014-11-27 ENCOUNTER — Other Ambulatory Visit: Payer: Self-pay | Admitting: Internal Medicine

## 2015-07-06 ENCOUNTER — Other Ambulatory Visit: Payer: Self-pay | Admitting: Internal Medicine

## 2015-07-10 ENCOUNTER — Other Ambulatory Visit: Payer: Self-pay | Admitting: Internal Medicine

## 2015-09-26 ENCOUNTER — Telehealth: Payer: Self-pay | Admitting: *Deleted

## 2015-09-26 ENCOUNTER — Encounter: Payer: Self-pay | Admitting: *Deleted

## 2015-09-26 NOTE — Telephone Encounter (Signed)
Pre-Visit Call completed with patient and chart updated.   Pre-Visit Info documented in Specialty Comments under [employer].    

## 2015-09-27 ENCOUNTER — Encounter: Payer: Self-pay | Admitting: Internal Medicine

## 2015-09-27 ENCOUNTER — Ambulatory Visit (INDEPENDENT_AMBULATORY_CARE_PROVIDER_SITE_OTHER): Payer: Medicare Other | Admitting: Internal Medicine

## 2015-09-27 VITALS — BP 108/58 | HR 63 | Temp 98.3°F | Ht 61.5 in | Wt 128.1 lb

## 2015-09-27 DIAGNOSIS — Z09 Encounter for follow-up examination after completed treatment for conditions other than malignant neoplasm: Secondary | ICD-10-CM

## 2015-09-27 DIAGNOSIS — Z0000 Encounter for general adult medical examination without abnormal findings: Secondary | ICD-10-CM

## 2015-09-27 DIAGNOSIS — Z1231 Encounter for screening mammogram for malignant neoplasm of breast: Secondary | ICD-10-CM | POA: Diagnosis not present

## 2015-09-27 DIAGNOSIS — M810 Age-related osteoporosis without current pathological fracture: Secondary | ICD-10-CM

## 2015-09-27 LAB — LIPID PANEL
CHOL/HDL RATIO: 3
Cholesterol: 179 mg/dL (ref 0–200)
HDL: 62.8 mg/dL (ref >39.00)
LDL Cholesterol: 98 mg/dL (ref 0–99)
NONHDL: 115.93
Triglycerides: 89 mg/dL (ref 0.0–149.0)
VLDL: 17.8 mg/dL (ref 0.0–40.0)

## 2015-09-27 LAB — BASIC METABOLIC PANEL
BUN: 12 mg/dL (ref 6–23)
CALCIUM: 9.7 mg/dL (ref 8.4–10.5)
CO2: 30 meq/L (ref 19–32)
CREATININE: 0.77 mg/dL (ref 0.40–1.20)
Chloride: 105 mEq/L (ref 96–112)
GFR: 79.16 mL/min (ref >60.00)
GLUCOSE: 105 mg/dL — AB (ref 70–99)
Potassium: 4.2 mEq/L (ref 3.5–5.1)
Sodium: 143 mEq/L (ref 135–145)

## 2015-09-27 LAB — CBC WITH DIFFERENTIAL/PLATELET
Basophils Absolute: 0 10*3/uL (ref 0.0–0.1)
Basophils Relative: 0.3 % (ref 0.0–3.0)
EOS PCT: 0.9 % (ref 0.0–5.0)
Eosinophils Absolute: 0.1 10*3/uL (ref 0.0–0.7)
HCT: 39.9 % (ref 36.0–46.0)
HEMOGLOBIN: 13.5 g/dL (ref 12.0–15.0)
Lymphocytes Relative: 18.5 % (ref 12.0–46.0)
Lymphs Abs: 1.4 10*3/uL (ref 0.7–4.0)
MCHC: 33.9 g/dL (ref 30.0–36.0)
MCV: 95 fl (ref 78.0–100.0)
MONOS PCT: 3.5 % (ref 3.0–12.0)
Monocytes Absolute: 0.3 10*3/uL (ref 0.1–1.0)
Neutro Abs: 5.8 10*3/uL (ref 1.4–7.7)
Neutrophils Relative %: 76.8 % (ref 43.0–77.0)
Platelets: 161 10*3/uL (ref 150.0–400.0)
RBC: 4.2 Mil/uL (ref 3.87–5.11)
RDW: 12.3 % (ref 11.5–15.5)
WBC: 7.5 10*3/uL (ref 4.0–10.5)

## 2015-09-27 LAB — TSH: TSH: 0.69 u[IU]/mL (ref 0.35–4.50)

## 2015-09-27 NOTE — Progress Notes (Signed)
Pre visit review using our clinic review tool, if applicable. No additional management support is needed unless otherwise documented below in the visit note. 

## 2015-09-27 NOTE — Assessment & Plan Note (Signed)
Anxiety depression: Well controlled. Osteoporosis: Continue calcium and vitamin D,continue Fosamax, exercise encourage, check a DEXA Tobacco abuse: Risks and benefits of quittining discussed RTC one year

## 2015-09-27 NOTE — Assessment & Plan Note (Addendum)
Td 07-2013;PNM shot 07-2013; prevnar--2016; zostavax --09-2013  Pap-- done with Dr. Benjie Karvonen in 2015, no h/o abnormal PAPs, pt desires no further screening, ok per guidelines [facility]-- 09/09/13 -- Neg, will schedule one  Declined breast exam   Had a Cscope for bleeding 1999 d/t rectal bleed,cologuard neg 2015   H/o palpable abdominal aorta, neg  Ultrasound 2015  Diet and exercise discussed

## 2015-09-27 NOTE — Progress Notes (Signed)
Subjective:    Patient ID: [name], female    DOB: [birth year], 70 y.o.   MRN: [id number]  DOS:  09/27/2015 Type of visit - description : CPX Interval history:  In general feeling well, no major concerns  Review of Systems Constitutional: No fever. No chills. No unexplained wt changes. No unusual sweats  HEENT: No dental problems, no ear discharge, no facial swelling, no voice changes. No eye discharge, no eye  redness , no  intolerance to light   Respiratory: No wheezing , no  difficulty breathing. No cough , no mucus production  Cardiovascular: No CP, no leg swelling , no  Palpitations  GI: no nausea, no vomiting, no diarrhea , no  abdominal pain.  No blood in the stools. No dysphagia, no odynophagia    Endocrine: No polyphagia, no polyuria , no polydipsia  GU: No dysuria, gross hematuria, difficulty urinating. No urinary urgency, no frequency.  Musculoskeletal: No joint swellings or unusual aches or pains  Skin: No change in the color of the skin, palor , no  Rash  Allergic, immunologic: No environmental allergies , no  food allergies  Neurological: No dizziness no  syncope. No headaches. No diplopia, no slurred, no slurred speech, no motor deficits, no facial  Numbness  Hematological: No enlarged lymph nodes, no easy bruising , no unusual bleedings  Psychiatry: No suicidal ideas, no hallucinations, no beavior problems, no confusion.  No unusual/severe anxiety, no depression   Past Medical History  Diagnosis Date  . Depression     h/o   . Anxiety and depression 07/23/2013    Past Surgical History  Procedure Laterality Date  . Surgery      R arm and shoulder  . Tubal ligation      Social History   Social History  . Marital Status: Married    Spouse Name: N/A  . Number of Children: 0  . Years of Education: N/A   Occupational History  . retired, catering     Social History Main Topics  . Smoking status: Current Every Day Smoker  . Smokeless  tobacco: Never Used     Comment: 1 ppd started 1987  . Alcohol Use: No  . Drug Use: No  . Sexual Activity: Not on file   Other Topics Concern  . Not on file   Social History Narrative   [employer] education.   Household pt and husband . No other family    Family History  Problem Relation Age of Onset  . Breast cancer Mother     breast  . Colon cancer Neg Hx   . CAD Other     MI GF age 5  . Diabetes Father   . Ovarian cancer Other     GM  . Dementia Mother         Medication List       This list is accurate as of: 09/27/15  1:11 PM.  Always use your most recent med list.               alendronate 70 MG tablet  Commonly known as:  FOSAMAX  Take 1 tablet (70 mg total) by mouth once a week. Take on an empty stomach with a glass of water.     cholecalciferol 1000 units tablet  Commonly known as:  VITAMIN D  Take 1,000 Units by mouth daily.     escitalopram 10 MG tablet  Commonly known as:  LEXAPRO  Take 1 tablet (10  mg total) by mouth daily.           Objective:   Physical Exam BP 108/58 mmHg  Pulse 63  Temp(Src) 98.3 F (36.8 C) (Oral)  Ht 5' 1.5" (1.562 m)  Wt 128 lb 2 oz (58.117 kg)  BMI 23.82 kg/m2  SpO2 97%  General:   Well developed, well nourished . NAD.  Neck: No  thyromegaly , normal carotid pulse HEENT:  Normocephalic . Face symmetric, atraumatic Lungs:  CTA B Normal respiratory effort, no intercostal retractions, no accessory muscle use. Heart: RRR,  no murmur.  No pretibial edema bilaterally  Abdomen:  Not distended, soft, non-tender. No rebound or rigidity.   Skin: Exposed areas without rash. Not pale. Not jaundice Neurologic:  alert & oriented X3.  Speech normal, gait appropriate for age and unassisted Strength symmetric and appropriate for age.  Psych: Cognition and judgment appear intact.  Cooperative with normal attention span and concentration.  Behavior appropriate. No anxious or depressed appearing.    Assessment &  Plan:   Assessment Anxiety depression Osteoporosis t score -2.9 (09-2013). Started Fosamax 2015    Plan Anxiety depression: Well controlled. Osteoporosis: Continue calcium and vitamin D,continue Fosamax, exercise encourage, check a DEXA Tobacco abuse: Risks and benefits of quittining discussed RTC one year  RTC one year

## 2015-09-27 NOTE — Patient Instructions (Signed)
Get your blood work before you leave   Next visit 1 year    Fall Prevention and Home Safety Falls cause injuries and can affect all age groups. It is possible to use preventive measures to significantly decrease the likelihood of falls. There are many simple measures which can make your home safer and prevent falls. OUTDOORS  Repair cracks and edges of walkways and driveways.  Remove high doorway thresholds.  Trim shrubbery on the main path into your home.  Have good outside lighting.  Clear walkways of tools, rocks, debris, and clutter.  Check that handrails are not broken and are securely fastened. Both sides of steps should have handrails.  Have leaves, snow, and ice cleared regularly.  Use sand or salt on walkways during winter months.  In the garage, clean up grease or oil spills. BATHROOM  Install night lights.  Install grab bars by the toilet and in the tub and shower.  Use non-skid mats or decals in the tub or shower.  Place a plastic non-slip stool in the shower to sit on, if needed.  Keep floors dry and clean up all water on the floor immediately.  Remove soap buildup in the tub or shower on a regular basis.  Secure bath mats with non-slip, double-sided rug tape.  Remove throw rugs and tripping hazards from the floors. BEDROOMS  Install night lights.  Make sure a bedside light is easy to reach.  Do not use oversized bedding.  Keep a telephone by your bedside.  Have a firm chair with side arms to use for getting dressed.  Remove throw rugs and tripping hazards from the floor. KITCHEN  Keep handles on pots and pans turned toward the center of the stove. Use back burners when possible.  Clean up spills quickly and allow time for drying.  Avoid walking on wet floors.  Avoid hot utensils and knives.  Position shelves so they are not too high or low.  Place commonly used objects within easy reach.  If necessary, use a sturdy step stool with a  grab bar when reaching.  Keep electrical cables out of the way.  Do not use floor polish or wax that makes floors slippery. If you must use wax, use non-skid floor wax.  Remove throw rugs and tripping hazards from the floor. STAIRWAYS  Never leave objects on stairs.  Place handrails on both sides of stairways and use them. Fix any loose handrails. Make sure handrails on both sides of the stairways are as long as the stairs.  Check carpeting to make sure it is firmly attached along stairs. Make repairs to worn or loose carpet promptly.  Avoid placing throw rugs at the top or bottom of stairways, or properly secure the rug with carpet tape to prevent slippage. Get rid of throw rugs, if possible.  Have an electrician put in a light switch at the top and bottom of the stairs. OTHER FALL PREVENTION TIPS  Wear low-heel or rubber-soled shoes that are supportive and fit well. Wear closed toe shoes.  When using a stepladder, make sure it is fully opened and both spreaders are firmly locked. Do not climb a closed stepladder.  Add color or contrast paint or tape to grab bars and handrails in your home. Place contrasting color strips on first and last steps.  Learn and use mobility aids as needed. Install an electrical emergency response system.  Turn on lights to avoid dark areas. Replace light bulbs that burn out immediately. Get light   Get light switches that glow.  Arrange furniture to create clear pathways. Keep furniture in the same place.  Firmly attach carpet with non-skid or double-sided tape.  Eliminate uneven floor surfaces.  Select a carpet pattern that does not visually hide the edge of steps.  Be aware of all pets. OTHER HOME SAFETY TIPS  Set the water temperature for 120 F (48.8 C).  Keep emergency numbers on or near the telephone.  Keep smoke detectors on every level of the home and near sleeping areas. Document Released: 06/22/2002 Document Revised: 01/01/2012  Document Reviewed: 09/21/2011 ExitCare Patient Information 2015 [employer]. This information is not intended to replace advice given to you by your health care provider. Make sure you discuss any questions you have with your health care provider.   Preventive Care for Adults Ages 65 and over  Blood pressure check.** / Every 1 to 2 years.  Lipid and cholesterol check.**/ Every 5 years beginning at age 20.  Lung cancer screening. / Every year if you are aged 55-80 years and have a 30-pack-year history of smoking and currently smoke or have quit within the past 15 years. Yearly screening is stopped once you have quit smoking for at least 15 years or develop a health problem that would prevent you from having lung cancer treatment.  Fecal occult blood test (FOBT) of stool. / Every year beginning at age 50 and continuing until age 75. You may not have to do this test if you get a colonoscopy every 10 years.  Flexible sigmoidoscopy** or colonoscopy.** / Every 5 years for a flexible sigmoidoscopy or every 10 years for a colonoscopy beginning at age 50 and continuing until age 75.  Hepatitis C blood test.** / For all people born from 1945 through 1965 and any individual with known risks for hepatitis C.  Abdominal aortic aneurysm (AAA) screening.** / A one-time screening for ages 65 to 75 years who are current or former smokers.  Skin self-exam. / Monthly.  Influenza vaccine. / Every year.  Tetanus, diphtheria, and acellular pertussis (Tdap/Td) vaccine.** / 1 dose of Td every 10 years.  Varicella vaccine.** / Consult your health care provider.  Zoster vaccine.** / 1 dose for adults aged 60 years or older.  Pneumococcal 13-valent conjugate (PCV13) vaccine.** / Consult your health care provider.  Pneumococcal polysaccharide (PPSV23) vaccine.** / 1 dose for all adults aged 65 years and older.  Meningococcal vaccine.** / Consult your health care provider.  Hepatitis A vaccine.** /  Consult your health care provider.  Hepatitis B vaccine.** / Consult your health care provider.  Haemophilus influenzae type b (Hib) vaccine.** / Consult your health care provider. **Family history and personal history of risk and conditions may change your health care provider's recommendations. Document Released: 08/28/2001 Document Revised: 07/07/2013 Document Reviewed: 11/27/2010 ExitCare Patient Information 2015 [employer]. This information is not intended to replace advice given to you by your health care provider. Make sure you discuss any questions you have with your health care provider.   

## 2015-09-28 ENCOUNTER — Other Ambulatory Visit: Payer: Self-pay | Admitting: Internal Medicine

## 2015-10-02 ENCOUNTER — Other Ambulatory Visit: Payer: Self-pay | Admitting: Internal Medicine

## 2015-11-03 ENCOUNTER — Ambulatory Visit
Admission: RE | Admit: 2015-11-03 | Discharge: 2015-11-03 | Disposition: A | Discharge status: Home / Self Care | Payer: Medicare Other | Source: Ambulatory Visit | Attending: Internal Medicine | Admitting: Internal Medicine

## 2015-11-03 DIAGNOSIS — Z1231 Encounter for screening mammogram for malignant neoplasm of breast: Secondary | ICD-10-CM | POA: Diagnosis not present

## 2016-01-02 DIAGNOSIS — H5203 Hypermetropia, bilateral: Secondary | ICD-10-CM | POA: Diagnosis not present

## 2016-01-02 DIAGNOSIS — H2513 Age-related nuclear cataract, bilateral: Secondary | ICD-10-CM | POA: Diagnosis not present

## 2016-03-14 ENCOUNTER — Other Ambulatory Visit: Payer: Self-pay | Admitting: Internal Medicine

## 2016-03-14 MED ORDER — ALENDRONATE SODIUM 70 MG PO TABS: 70.0000 mg | ORAL_TABLET | ORAL | 3 refills | Status: DC

## 2016-03-14 NOTE — Addendum Note (Signed)
Addended byDamita Dunnings D on: 03/14/2016 07:51 AM   Modules accepted: Orders

## 2016-09-09 ENCOUNTER — Other Ambulatory Visit: Payer: Self-pay | Admitting: Internal Medicine

## 2016-09-27 ENCOUNTER — Telehealth: Payer: Self-pay

## 2016-09-27 ENCOUNTER — Encounter: Payer: Self-pay | Admitting: Internal Medicine

## 2016-09-27 ENCOUNTER — Ambulatory Visit (INDEPENDENT_AMBULATORY_CARE_PROVIDER_SITE_OTHER): Payer: Medicare Other | Admitting: Internal Medicine

## 2016-09-27 VITALS — BP 126/72 | HR 69 | Temp 98.1°F | Resp 14 | Ht 62.0 in | Wt 127.5 lb

## 2016-09-27 DIAGNOSIS — M810 Age-related osteoporosis without current pathological fracture: Secondary | ICD-10-CM | POA: Diagnosis not present

## 2016-09-27 DIAGNOSIS — R739 Hyperglycemia, unspecified: Secondary | ICD-10-CM

## 2016-09-27 DIAGNOSIS — Z1159 Encounter for screening for other viral diseases: Secondary | ICD-10-CM

## 2016-09-27 DIAGNOSIS — R195 Other fecal abnormalities: Secondary | ICD-10-CM

## 2016-09-27 DIAGNOSIS — Z0000 Encounter for general adult medical examination without abnormal findings: Secondary | ICD-10-CM

## 2016-09-27 DIAGNOSIS — F17200 Nicotine dependence, unspecified, uncomplicated: Secondary | ICD-10-CM

## 2016-09-27 LAB — BASIC METABOLIC PANEL
BUN: 15 mg/dL (ref 6–23)
CO2: 30 mEq/L (ref 19–32)
Calcium: 10.1 mg/dL (ref 8.4–10.5)
Chloride: 106 mEq/L (ref 96–112)
Creatinine, Ser: 0.84 mg/dL (ref 0.40–1.20)
GFR: 71.38 mL/min (ref >60.00)
Glucose, Bld: 108 mg/dL — ABNORMAL HIGH (ref 70–99)
Potassium: 4 mEq/L (ref 3.5–5.1)
Sodium: 143 mEq/L (ref 135–145)

## 2016-09-27 LAB — HEMOGLOBIN A1C: Hgb A1c MFr Bld: 5.3 % (ref 4.6–6.5)

## 2016-09-27 LAB — HEPATITIS C ANTIBODY: HCV Ab: NEGATIVE

## 2016-09-27 NOTE — Telephone Encounter (Signed)
Cologuard order initiated via [employer] portal. Order number: [id number]. Order confirmation sent for scanning.

## 2016-09-27 NOTE — Assessment & Plan Note (Signed)
Anxiety depression: Well controlled.Continue with Lexapro Osteoporosis:  Continue calcium, vitamin D and Fosamax. Check a bone density test. Tobacco abuse: Not ready to quit. She knows to call when ready, we could try medications, also nicotine supplements Pain, right trapezoid: Stretching discuss, Tylenol as needed. Call if no better RTC one year

## 2016-09-27 NOTE — Assessment & Plan Note (Addendum)
Td 07-2013;PNM shot 07-2013; prevnar--2016; zostavax --09-2013; had a flu shot, no documented --No further PAPs, see comments from 2017  --MMG--10-2015 neg  --Had a Cscope for bleeding 1999 d/t rectal bleed,cologuard neg 2015;cologuard sent today --H/o palpable abdominal aorta, neg  Ultrasound 2015 --Lung Ca screening- discussed, will arrange --Labs: BMP, A1c, vitamin D, A1c Diet and exercise discussed

## 2016-09-27 NOTE — Patient Instructions (Signed)
GO TO THE LAB : Get the blood work     GO TO THE FRONT DESK Schedule your next appointment for a  physical exam in one year  Schedule Medicare wellness with one of our RNs   Will send a COLOGUARD  Continue calcium, vitamin D and Fosamax, will check a bone density test

## 2016-09-27 NOTE — Progress Notes (Signed)
Pre visit review using our clinic review tool, if applicable. No additional management support is needed unless otherwise documented below in the visit note. 

## 2016-09-27 NOTE — Progress Notes (Signed)
Subjective:    Patient ID: [name], female    DOB: 1946/06/04, 71 y.o.   MRN: [id number]  DOS:  09/27/2016 Type of visit - description : cpx Interval history: In general feeling great, no major concerns   Review of Systems 2 months history of right trapezoid pain, only with certain movements, no neck pain per se, no injury.  Other than above, a 14 point review of systems is negative      Past Medical History:  Diagnosis Date  . Anxiety and depression 07/23/2013  . Depression    h/o     Past Surgical History:  Procedure Laterality Date  . surgery     R arm and shoulder  . TUBAL LIGATION      Social History   Social History  . Marital status: Married    Spouse name: N/A  . Number of children: 0  . Years of education: N/A   Occupational History  . retired, catering     Social History Main Topics  . Smoking status: Current Every Day Smoker  . Smokeless tobacco: Never Used     Comment: 1 ppd started 1987  . Alcohol use No  . Drug use: No  . Sexual activity: Not on file   Other Topics Concern  . Not on file   Social History Narrative   [employer] education.   Household pt and husband . No other family    Family History  Problem Relation Age of Onset  . Breast cancer Mother     breast  . Dementia Mother   . CAD Other     MI GF age 32  . Diabetes Father   . Ovarian cancer Maternal Grandmother   . Colon cancer Neg Hx       Allergies as of 09/27/2016   No Known Allergies     Medication List       Accurate as of 09/27/16  2:04 PM. Always use your most recent med list.          alendronate 70 MG tablet Commonly known as:  FOSAMAX Take 1 tablet (70 mg total) by mouth once a week. Take on an empty stomach with a full glass of water.   cholecalciferol 1000 units tablet Commonly known as:  VITAMIN D Take 1,000 Units by mouth daily.   escitalopram 10 MG tablet Commonly known as:  LEXAPRO Take 1 tablet (10 mg total) by mouth daily.            Objective:   Physical Exam BP 126/72 (BP Location: Left Arm, Patient Position: Sitting, Cuff Size: Small)   Pulse 69   Temp 98.1 F (36.7 C) (Oral)   Resp 14   Ht 5\' 2"  (1.575 m)   Wt 127 lb 8 oz (57.8 kg)   SpO2 97%   BMI 23.32 kg/m   General:   Well developed, well nourished . NAD.  Neck: No  thyromegaly ; no TTP at the cervical spine, range of motion normal MSK: Shoulder range of motion slightly limited on the right, had surgery there,  otherwise normal HEENT:  Normocephalic . Face symmetric, atraumatic Lungs:  CTA B Normal respiratory effort, no intercostal retractions, no accessory muscle use. Heart: RRR,  no murmur.  No pretibial edema bilaterally  Abdomen:  Not distended, soft, non-tender. No rebound or rigidity.   Skin: Exposed areas without rash. Not pale. Not jaundice Neurologic:  alert & oriented X3.  Speech normal, gait appropriate for age  and unassisted Strength symmetric and appropriate for age.  Psych: Cognition and judgment appear intact.  Cooperative with normal attention span and concentration.  Behavior appropriate. No anxious or depressed appearing.    Assessment & Plan:   Assessment Anxiety (fear of ridding a car), depression Osteoporosis t score -2.9 (09-2013). Started Fosamax 2015    Plan Anxiety depression: Well controlled.Continue with Lexapro Osteoporosis:  Continue calcium, vitamin D and Fosamax. Check a bone density test. Tobacco abuse: Not ready to quit. She knows to call when ready, we could try medications, also nicotine supplements Pain, right trapezoid: Stretching discuss, Tylenol as needed. Call if no better RTC one year

## 2016-09-30 ENCOUNTER — Ambulatory Visit (HOSPITAL_BASED_OUTPATIENT_CLINIC_OR_DEPARTMENT_OTHER): Payer: Self-pay

## 2016-09-30 LAB — VITAMIN D 1,25 DIHYDROXY
VITAMIN D 1, 25 (OH) TOTAL: 24 pg/mL (ref 18–72)
VITAMIN D3 1, 25 (OH): 24 pg/mL
Vitamin D2 1, 25 (OH)2: <8 pg/mL

## 2016-10-02 ENCOUNTER — Telehealth: Payer: Self-pay

## 2016-10-02 ENCOUNTER — Ambulatory Visit (HOSPITAL_BASED_OUTPATIENT_CLINIC_OR_DEPARTMENT_OTHER)
Admission: RE | Admit: 2016-10-02 | Discharge: 2016-10-02 | Disposition: A | Discharge status: Home / Self Care | Payer: Medicare Other | Source: Ambulatory Visit | Attending: Internal Medicine | Admitting: Internal Medicine

## 2016-10-02 ENCOUNTER — Other Ambulatory Visit: Payer: Self-pay

## 2016-10-02 DIAGNOSIS — M810 Age-related osteoporosis without current pathological fracture: Secondary | ICD-10-CM | POA: Insufficient documentation

## 2016-10-02 DIAGNOSIS — R918 Other nonspecific abnormal finding of lung field: Secondary | ICD-10-CM

## 2016-10-02 DIAGNOSIS — I700 Atherosclerosis of aorta: Secondary | ICD-10-CM | POA: Insufficient documentation

## 2016-10-02 DIAGNOSIS — N200 Calculus of kidney: Secondary | ICD-10-CM | POA: Insufficient documentation

## 2016-10-02 DIAGNOSIS — Z136 Encounter for screening for cardiovascular disorders: Secondary | ICD-10-CM | POA: Diagnosis not present

## 2016-10-02 DIAGNOSIS — I2510 Atherosclerotic heart disease of native coronary artery without angina pectoris: Secondary | ICD-10-CM | POA: Insufficient documentation

## 2016-10-02 DIAGNOSIS — Z87891 Personal history of nicotine dependence: Secondary | ICD-10-CM | POA: Diagnosis not present

## 2016-10-02 DIAGNOSIS — F17210 Nicotine dependence, cigarettes, uncomplicated: Secondary | ICD-10-CM | POA: Insufficient documentation

## 2016-10-02 DIAGNOSIS — F17200 Nicotine dependence, unspecified, uncomplicated: Secondary | ICD-10-CM | POA: Diagnosis present

## 2016-10-02 NOTE — Telephone Encounter (Signed)
Spoke w/ Opal Sidles from [facility]: 10.42mm nodule R upper lobe: LungRads: Category 4A: suspicious. Recommend low dose CT w/o in 3 months; alternatively PET scan since nodule is greater than 8 mm. Also, showing L upper endobrachial nodule vs area of mucous plugging measuring at 4.54mm. PCP informed verbally and via telephone note.

## 2016-10-02 NOTE — Telephone Encounter (Signed)
Urgent pulmonary referral placed.

## 2016-10-02 NOTE — Telephone Encounter (Signed)
Spoke with the patient about the results, I recommend a pulmonary referral for further advice. Will arrange. Patient was quite concerned, I counseled her the best of my ability, she is on Lexapro, offered  Xanax or Ativan as needed. Declined but  will call if needed. Encourage to call me if there are questions.

## 2016-10-05 ENCOUNTER — Encounter: Payer: Self-pay | Admitting: Pulmonary Disease

## 2016-10-05 ENCOUNTER — Ambulatory Visit (INDEPENDENT_AMBULATORY_CARE_PROVIDER_SITE_OTHER): Payer: Medicare Other | Admitting: Pulmonary Disease

## 2016-10-05 VITALS — BP 112/74 | HR 62 | Ht 62.0 in | Wt 126.0 lb

## 2016-10-05 DIAGNOSIS — R911 Solitary pulmonary nodule: Secondary | ICD-10-CM | POA: Diagnosis not present

## 2016-10-05 DIAGNOSIS — N200 Calculus of kidney: Secondary | ICD-10-CM | POA: Diagnosis not present

## 2016-10-05 DIAGNOSIS — Z720 Tobacco use: Secondary | ICD-10-CM

## 2016-10-05 NOTE — Patient Instructions (Signed)
CT chest without contrast in 3 months Spirometry pre-and post on next visit We discussed smoking cessation- start with nicotine patch 21 mg per day, if this does not work on his back in 4 weeks for alternative

## 2016-10-05 NOTE — Progress Notes (Signed)
   Subjective:    Patient ID: [name], female    DOB: 1945/09/29, 71 y.o.   MRN: [id number]  HPI  71 year old smoker referred for evaluation of abnormal screening CT chest. She smokes about a pack per day starting at age 36, more than 30-pack-years. She denies dyspnea, cough or congestion. She denies frequent chest colds or wheezing. She had an annual physical and agreed to undergo screening CT scan  Low-dose screening CT chest 10/02/16 showed a nodule in the right upper lobe about 11 mm with partial groundglass and solid component about 6 mm. There was another 5 mm nodule noted in the left upper lobe bronchus which could be a mucous plug  Hence she is referred. I note a CT abdomen from 2009 which showed left kidney stone. Current CT scan also shows bilateral renal calculi   Past Medical History:  Diagnosis Date  . Anxiety and depression 07/23/2013  . Depression    h/o      Past Surgical History:  Procedure Laterality Date  . surgery     R arm and shoulder  . TUBAL LIGATION      No Known Allergies   Social History   Social History  . Marital status: Married    Spouse name: N/A  . Number of children: 0  . Years of education: N/A   Occupational History  . retired, catering     Social History Main Topics  . Smoking status: Current Every Day Smoker  . Smokeless tobacco: Never Used     Comment: 1 ppd started 1987  . Alcohol use No  . Drug use: No  . Sexual activity: Not on file   Other Topics Concern  . Not on file   Social History Narrative   [employer] education.   Household pt and husband . No other family      Family History  Problem Relation Age of Onset  . Breast cancer Mother     breast  . Dementia Mother   . CAD Other     MI GF age 48  . Diabetes Father   . Ovarian cancer Maternal Grandmother   . Colon cancer Neg Hx      Review of Systems Constitutional: negative for anorexia, fevers and sweats  Eyes: negative for irritation, redness  and visual disturbance  Ears, nose, mouth, throat, and face: negative for earaches, epistaxis, nasal congestion and sore throat  Respiratory: negative for cough, dyspnea on exertion, sputum and wheezing  Cardiovascular: negative for chest pain, dyspnea, lower extremity edema, orthopnea, palpitations and syncope  Gastrointestinal: negative for abdominal pain, constipation, diarrhea, melena, nausea and vomiting  Genitourinary:negative for dysuria, frequency and hematuria  Hematologic/lymphatic: negative for bleeding, easy bruising and lymphadenopathy  Musculoskeletal:negative for arthralgias, muscle weakness and stiff joints  Neurological: negative for coordination problems, gait problems, headaches and weakness  Endocrine: negative for diabetic symptoms including polydipsia, polyuria and weight loss     Objective:   Physical Exam  Gen. Pleasant, well-nourished, in no distress ENT - no lesions, no post nasal drip Neck: No JVD, no thyromegaly, no carotid bruits Lungs: no use of accessory muscles, no dullness to percussion, clear without rales or rhonchi  Cardiovascular: Rhythm regular, heart sounds  normal, no murmurs or gallops, no peripheral edema Musculoskeletal: No deformities, no cyanosis or clubbing        Assessment & Plan:

## 2016-10-05 NOTE — Assessment & Plan Note (Signed)
Spirometry pre-and post on next visit We discussed smoking cessation- start with nicotine patch 21 mg per day, if this does not work on his back in 4 weeks for alternative  She was not ready to commit to a quit attempt but agreed to try and cut down

## 2016-10-05 NOTE — Progress Notes (Signed)
   Subjective:    Patient ID: [name], female    DOB: 09-23-1945, 71 y.o.   MRN: [id number]  HPI    Review of Systems  Constitutional: Negative for fever and unexpected weight change.  HENT: Negative for congestion, dental problem, ear pain, nosebleeds, postnasal drip, rhinorrhea, sinus pressure, sneezing, sore throat and trouble swallowing.   Eyes: Negative for redness and itching.  Respiratory: Negative for cough, chest tightness, shortness of breath and wheezing.   Cardiovascular: Negative for palpitations and leg swelling.  Gastrointestinal: Negative for nausea and vomiting.  Genitourinary: Negative for dysuria.  Musculoskeletal: Negative for joint swelling.  Skin: Negative for rash.  Neurological: Negative for headaches.  Hematological: Does not bruise/bleed easily.  Psychiatric/Behavioral: Negative for dysphoric mood. The patient is not nervous/anxious.        Objective:   Physical Exam        Assessment & Plan:

## 2016-10-05 NOTE — Assessment & Plan Note (Signed)
Unfortunately no prior scan for comparison. Differential diagnosis is broad and could include slow growing early adenocarcinoma in this heavy smoker but also benign etiologies such as scarring or inflammation.  Not particularly worried about endobronchial 5 mm left lower lobe lesion which could be mucus impaction  CT chest without contrast in 3 months

## 2016-10-05 NOTE — Assessment & Plan Note (Signed)
Hydration, no other intervention at this time

## 2016-10-18 DIAGNOSIS — Z1211 Encounter for screening for malignant neoplasm of colon: Secondary | ICD-10-CM | POA: Diagnosis not present

## 2016-10-18 LAB — COLOGUARD: COLOGUARD: POSITIVE

## 2016-10-29 NOTE — Telephone Encounter (Signed)
Received Cologuard result: Positive. Result placed in PCP red folder for review.

## 2016-10-31 NOTE — Telephone Encounter (Signed)
Cologuard result abstracted, GI referral placed and results sent for scanning.

## 2016-10-31 NOTE — Telephone Encounter (Signed)
Cologuard screening came back positive, called pt, she is aware. Plan:  Please enetr a order for GI referal Also going to share  this information via message with Dr. Elsworth Soho who recently saw her for an abnormal CT chest.

## 2016-11-01 ENCOUNTER — Encounter: Payer: Self-pay | Admitting: Gastroenterology

## 2016-11-26 ENCOUNTER — Other Ambulatory Visit: Payer: Self-pay | Admitting: Internal Medicine

## 2016-12-26 ENCOUNTER — Ambulatory Visit (AMBULATORY_SURGERY_CENTER): Payer: Self-pay | Admitting: *Deleted

## 2016-12-26 ENCOUNTER — Encounter: Payer: Self-pay | Admitting: Gastroenterology

## 2016-12-26 VITALS — Ht 61.5 in | Wt 127.0 lb

## 2016-12-26 DIAGNOSIS — R195 Other fecal abnormalities: Secondary | ICD-10-CM

## 2016-12-26 MED ORDER — NA SULFATE-K SULFATE-MG SULF 17.5-3.13-1.6 GM/177ML PO SOLN: 1.0000 | Freq: Once | ORAL | 0 refills | Status: AC

## 2016-12-26 NOTE — Progress Notes (Signed)
No egg or soy allergy known to patient  No issues with past sedation with any surgeries  or procedures, no intubation problems  No diet pills per patient No home 02 use per patient  No blood thinners per patient  Pt denies issues with constipation  No A fib or A flutter  EMMI video sent to pt's e mail  Husband in [place] with pt today - questions answered

## 2017-01-04 ENCOUNTER — Telehealth: Payer: Self-pay | Admitting: Pulmonary Disease

## 2017-01-04 NOTE — Telephone Encounter (Signed)
Regular CT chest without contrast

## 2017-01-04 NOTE — Telephone Encounter (Signed)
Pt has a ct chest wo contrast scheduled for Monday.  On 3/20 pt had a low dose screening CT that showed lung rads 4A.  Stacey at [facility] is requesting we verify with RA if this CT needs to be a regular CT wo contrast or a low dose CT.    Per Erline Levine, we can leave a detailed message on VM to clarify if she does not answer.  RA please clarify which CT should be performed.  Thanks!

## 2017-01-04 NOTE — Telephone Encounter (Signed)
Spoke with Erline Levine. She is aware of no changes. Nothing else needed.

## 2017-01-07 ENCOUNTER — Ambulatory Visit (INDEPENDENT_AMBULATORY_CARE_PROVIDER_SITE_OTHER)
Admission: RE | Admit: 2017-01-07 | Discharge: 2017-01-07 | Disposition: A | Discharge status: Home / Self Care | Payer: Medicare Other | Source: Ambulatory Visit | Attending: Pulmonary Disease | Admitting: Pulmonary Disease

## 2017-01-07 DIAGNOSIS — R918 Other nonspecific abnormal finding of lung field: Secondary | ICD-10-CM | POA: Diagnosis not present

## 2017-01-07 DIAGNOSIS — R911 Solitary pulmonary nodule: Secondary | ICD-10-CM | POA: Diagnosis not present

## 2017-01-09 ENCOUNTER — Encounter: Payer: Self-pay | Admitting: Gastroenterology

## 2017-01-09 ENCOUNTER — Ambulatory Visit (AMBULATORY_SURGERY_CENTER): Payer: Medicare Other | Admitting: Gastroenterology

## 2017-01-09 VITALS — BP 121/60 | HR 61 | Temp 99.1°F | Resp 13 | Ht 61.5 in | Wt 127.0 lb

## 2017-01-09 DIAGNOSIS — D125 Benign neoplasm of sigmoid colon: Secondary | ICD-10-CM | POA: Diagnosis not present

## 2017-01-09 DIAGNOSIS — R195 Other fecal abnormalities: Secondary | ICD-10-CM | POA: Diagnosis present

## 2017-01-09 DIAGNOSIS — D123 Benign neoplasm of transverse colon: Secondary | ICD-10-CM

## 2017-01-09 DIAGNOSIS — K6389 Other specified diseases of intestine: Secondary | ICD-10-CM | POA: Diagnosis not present

## 2017-01-09 DIAGNOSIS — K639 Disease of intestine, unspecified: Secondary | ICD-10-CM

## 2017-01-09 MED ORDER — SODIUM CHLORIDE 0.9 % IV SOLN: 500.0000 mL | INTRAVENOUS | Status: DC

## 2017-01-09 NOTE — Patient Instructions (Signed)
Discharge instructions given. Handouts on polyps and diverticulosis. No ibuprofen, naproxen, or other NSAIDs for two weeks. Resume previous medications. YOU HAD AN ENDOSCOPIC PROCEDURE TODAY AT [facility]:   Refer to the procedure report that was given to you for any specific questions about what was found during the examination.  If the procedure report does not answer your questions, please call your gastroenterologist to clarify.  If you requested that your care partner not be given the details of your procedure findings, then the procedure report has been included in a sealed envelope for you to review at your convenience later.  YOU SHOULD EXPECT: Some feelings of bloating in the abdomen. Passage of more gas than usual.  Walking can help get rid of the air that was put into your GI tract during the procedure and reduce the bloating. If you had a lower endoscopy (such as a colonoscopy or flexible sigmoidoscopy) you may notice spotting of blood in your stool or on the toilet paper. If you underwent a bowel prep for your procedure, you may not have a normal bowel movement for a few days.  Please Note:  You might notice some irritation and congestion in your nose or some drainage.  This is from the oxygen used during your procedure.  There is no need for concern and it should clear up in a day or so.  SYMPTOMS TO REPORT IMMEDIATELY:   Following lower endoscopy (colonoscopy or flexible sigmoidoscopy):  Excessive amounts of blood in the stool  Significant tenderness or worsening of abdominal pains  Swelling of the abdomen that is new, acute  Fever of 100F or higher  For urgent or emergent issues, a gastroenterologist can be reached at any hour by calling [phone].   DIET:  We do recommend a small meal at first, but then you may proceed to your regular diet.  Drink plenty of fluids but you should avoid alcoholic beverages for 24 hours.  ACTIVITY:  You should plan to  take it easy for the rest of today and you should NOT DRIVE or use heavy machinery until tomorrow (because of the sedation medicines used during the test).    FOLLOW UP: Our staff will call the number listed on your records the next business day following your procedure to check on you and address any questions or concerns that you may have regarding the information given to you following your procedure. If we do not reach you, we will leave a message.  However, if you are feeling well and you are not experiencing any problems, there is no need to return our call.  We will assume that you have returned to your regular daily activities without incident.  If any biopsies were taken you will be contacted by phone or by letter within the next 1-3 weeks.  Please call us at [phone] if you have not heard about the biopsies in 3 weeks.    SIGNATURES/CONFIDENTIALITY: You and/or your care partner have signed paperwork which will be entered into your electronic medical record.  These signatures attest to the fact that that the information above on your After Visit Summary has been reviewed and is understood.  Full responsibility of the confidentiality of this discharge information lies with you and/or your care-partner.

## 2017-01-09 NOTE — Op Note (Signed)
[name] Patient Name: [name] Procedure Date: 01/09/2017 8:33 AM MRN: [id number] Endoscopist: Remo Lipps P. Armbruster MD, MD Age: 71 Referring MD:  Date of Birth: [birth year] Gender: Female Account #: [id number] Procedure:                Colonoscopy Indications:              Positive Cologuard test Medicines:                Monitored Anesthesia Care Procedure:                Pre-Anesthesia Assessment:                           - Prior to the procedure, a History and Physical                            was performed, and patient medications and                            allergies were reviewed. The patient's tolerance of                            previous anesthesia was also reviewed. The risks                            and benefits of the procedure and the sedation                            options and risks were discussed with the patient.                            All questions were answered, and informed consent                            was obtained. Prior Anticoagulants: The patient has                            taken no previous anticoagulant or antiplatelet                            agents. ASA Grade Assessment: II - A patient with                            mild systemic disease. After reviewing the risks                            and benefits, the patient was deemed in                            satisfactory condition to undergo the procedure.                           After obtaining informed consent, the colonoscope  was passed under direct vision. Throughout the                            procedure, the patient's blood pressure, pulse, and                            oxygen saturations were monitored continuously. The                            Colonoscope was introduced through the anus and                            advanced to the the cecum, identified by                            appendiceal orifice and ileocecal valve.  The                            colonoscopy was performed without difficulty. The                            patient tolerated the procedure well. The quality                            of the bowel preparation was good. The ileocecal                            valve, appendiceal orifice, and rectum were                            photographed. Scope In: 8:36:53 AM Scope Out: 8:55:30 AM Scope Withdrawal Time: 0 hours 14 minutes 27 seconds  Total Procedure Duration: 0 hours 18 minutes 37 seconds  Findings:                 The perianal exam findings include skin tags.                           One 6 mm subepithelial nodule was found in the                            cecum. Bite on bite biopsies were taken with a cold                            forceps for histology.                           There was a medium-sized lipoma, in the ascending                            colon.                           Two sessile polyps were found in the transverse  colon. The polyps were 3 to 4 mm in size. These                            polyps were removed with a cold snare. Resection                            and retrieval were complete.                           A 3 mm polyp was found in the sigmoid colon. The                            polyp was sessile. The polyp was removed with a                            cold snare. Resection and retrieval were complete.                           A few small-mouthed diverticula were found in the                            left colon.                           The exam was otherwise without abnormality. Complications:            No immediate complications. Estimated blood loss:                            Minimal. Estimated Blood Loss:     Estimated blood loss was minimal. Impression:               - Perianal skin tags found on perianal exam.                           - Nodule in the cecum. Biopsied.                           -  Medium-sized lipoma in the ascending colon.                           - Two 3 to 4 mm polyps in the transverse colon,                            removed with a cold snare. Resected and retrieved.                           - One 3 mm polyp in the sigmoid colon, removed with                            a cold snare. Resected and retrieved.                           - Diverticulosis in the left colon.                           -  The examination was otherwise normal. Recommendation:           - Patient has a contact number available for                            emergencies. The signs and symptoms of potential                            delayed complications were discussed with the                            patient. Return to normal activities tomorrow.                            Written discharge instructions were provided to the                            patient.                           - Resume previous diet.                           - Continue present medications.                           - Await pathology results.                           - Repeat colonoscopy is recommended for                            surveillance. The colonoscopy date will be                            determined after pathology results from today's                            exam become available for review.                           - No ibuprofen, naproxen, or other non-steroidal                            anti-inflammatory drugs for 2 weeks after polyp                            removal. Remo Lipps P. Armbruster MD, MD 01/09/2017 9:01:05 AM This report has been signed electronically.

## 2017-01-09 NOTE — Progress Notes (Signed)
Pt's states no medical or surgical changes since previsit or office visit. 

## 2017-01-09 NOTE — Progress Notes (Signed)
Report to PACU, RN, vss, BBS= Clear.  

## 2017-01-09 NOTE — Progress Notes (Signed)
Called to room to assist during endoscopic procedure.  Patient ID and intended procedure confirmed with present staff. Received instructions for my participation in the procedure from the performing physician.  

## 2017-01-10 ENCOUNTER — Telehealth: Payer: Self-pay

## 2017-01-10 NOTE — Telephone Encounter (Signed)
  Follow up Call-  Call back number 01/09/2017  Post procedure Call Back phone  # [phone]  Permission to leave phone message Yes  Some recent data might be hidden     Patient questions:  Do you have a fever, pain , or abdominal swelling? No. Pain Score  0 *  Have you tolerated food without any problems? Yes.    Have you been able to return to your normal activities? Yes.    Do you have any questions about your discharge instructions: Diet   No. Medications  No. Follow up visit  No.  Do you have questions or concerns about your Care? No.  Actions: * If pain score is 4 or above: No action needed, pain <4.

## 2017-01-15 ENCOUNTER — Ambulatory Visit (INDEPENDENT_AMBULATORY_CARE_PROVIDER_SITE_OTHER): Payer: Medicare Other | Admitting: Pulmonary Disease

## 2017-01-15 ENCOUNTER — Encounter: Payer: Self-pay | Admitting: Pulmonary Disease

## 2017-01-15 ENCOUNTER — Other Ambulatory Visit: Payer: Self-pay | Admitting: Pulmonary Disease

## 2017-01-15 DIAGNOSIS — R0600 Dyspnea, unspecified: Secondary | ICD-10-CM | POA: Diagnosis not present

## 2017-01-15 DIAGNOSIS — F17200 Nicotine dependence, unspecified, uncomplicated: Secondary | ICD-10-CM | POA: Diagnosis not present

## 2017-01-15 DIAGNOSIS — R911 Solitary pulmonary nodule: Secondary | ICD-10-CM | POA: Diagnosis not present

## 2017-01-15 LAB — PULMONARY FUNCTION TEST
FEF 25-75 POST: 2.87 L/s
FEF 25-75 Pre: 2.25 L/sec
FEF2575-%CHANGE-POST: 27 %
FEF2575-%PRED-POST: 161 %
FEF2575-%Pred-Pre: 126 %
FEV1-%CHANGE-POST: 2 %
FEV1-%Pred-Post: 109 %
FEV1-%Pred-Pre: 106 %
FEV1-Post: 2.24 L
FEV1-Pre: 2.18 L
FEV1FVC-%CHANGE-POST: 3 %
FEV1FVC-%PRED-PRE: 108 %
FEV6-%Change-Post: 0 %
FEV6-%Pred-Post: 101 %
FEV6-%Pred-Pre: 101 %
FEV6-POST: 2.62 L
FEV6-Pre: 2.64 L
FEV6FVC-%Pred-Post: 104 %
FEV6FVC-%Pred-Pre: 104 %
FVC-%CHANGE-POST: -1 %
FVC-%PRED-POST: 96 %
FVC-%PRED-PRE: 97 %
FVC-POST: 2.62 L
FVC-PRE: 2.65 L
POST FEV1/FVC RATIO: 85 %
PRE FEV1/FVC RATIO: 82 %
PRE FEV6/FVC RATIO: 100 %
Post FEV6/FVC ratio: 100 %

## 2017-01-15 NOTE — Assessment & Plan Note (Signed)
Nodules have resolved. Annual low-dose CT screening is still recommended

## 2017-01-15 NOTE — Patient Instructions (Addendum)
Nodules have resolved. Annual low-dose CT screening is still recommended   Lung function is good. He is still have to work on quitting smoking. Trial of nicotine patch 14/21 mg daily Call us for prescription for nicotine inhaler once you have tried this

## 2017-01-15 NOTE — Assessment & Plan Note (Signed)
Lung function is good. You still have to work on quitting smoking. Trial of nicotine patch 14/21 mg daily Call us for prescription for nicotine inhaler once you have tried this

## 2017-01-15 NOTE — Progress Notes (Signed)
Spirometry pre and post done today. 

## 2017-01-15 NOTE — Progress Notes (Signed)
   Subjective:    Patient ID: [name], female    DOB: Dec 27, 1945, 71 y.o.   MRN: [id number]  HPI  71 year old smoker for FU of RUL nodule She smokes about a pack per day starting at age 52, more than 30-pack-years.  She is here to review her follow-up CT scan today. She has been able to decrease smoking to about 15 cigarettes per day  We reviewed PFTs which show normal lung function, no evidence of airway obstruction  Significant tests/ events reviewed  Low-dose screening CT chest 10/02/16 showed a nodule in the right upper lobe about 11 mm with partial groundglass and solid component about 6 mm. There was another 5 mm nodule noted in the left upper lobe bronchus which could be a mucous plug - bilateral renal calculi  Ct chest 01/07/2017 nodules resolved -images personally reviewed  Review of Systems Patient denies significant dyspnea,cough, hemoptysis,  chest pain, palpitations, pedal edema, orthopnea, paroxysmal nocturnal dyspnea, lightheadedness, nausea, vomiting, abdominal or  leg pains      Objective:   Physical Exam  Gen. Pleasant, well-nourished, in no distress ENT - no thrush, no post nasal drip Neck: No JVD, no thyromegaly, no carotid bruits Lungs: no use of accessory muscles, no dullness to percussion, clear without rales or rhonchi  Cardiovascular: Rhythm regular, heart sounds  normal, no murmurs or gallops, no peripheral edema Musculoskeletal: No deformities, no cyanosis or clubbing        Assessment & Plan:

## 2017-01-22 ENCOUNTER — Other Ambulatory Visit: Payer: Self-pay

## 2017-01-22 DIAGNOSIS — K639 Disease of intestine, unspecified: Secondary | ICD-10-CM

## 2017-01-22 DIAGNOSIS — R195 Other fecal abnormalities: Secondary | ICD-10-CM

## 2017-01-22 NOTE — Progress Notes (Signed)
C-

## 2017-01-23 ENCOUNTER — Other Ambulatory Visit (INDEPENDENT_AMBULATORY_CARE_PROVIDER_SITE_OTHER): Payer: Medicare Other

## 2017-01-23 DIAGNOSIS — R195 Other fecal abnormalities: Secondary | ICD-10-CM | POA: Diagnosis not present

## 2017-01-23 DIAGNOSIS — K639 Disease of intestine, unspecified: Secondary | ICD-10-CM | POA: Diagnosis not present

## 2017-01-23 LAB — CREATININE, SERUM: Creatinine, Ser: 0.86 mg/dL (ref 0.40–1.20)

## 2017-01-23 LAB — BUN: BUN: 15 mg/dL (ref 6–23)

## 2017-01-29 ENCOUNTER — Ambulatory Visit (INDEPENDENT_AMBULATORY_CARE_PROVIDER_SITE_OTHER)
Admission: RE | Admit: 2017-01-29 | Discharge: 2017-01-29 | Disposition: A | Discharge status: Home / Self Care | Payer: Medicare Other | Source: Ambulatory Visit | Attending: Gastroenterology | Admitting: Gastroenterology

## 2017-01-29 DIAGNOSIS — K639 Disease of intestine, unspecified: Secondary | ICD-10-CM

## 2017-01-29 DIAGNOSIS — R195 Other fecal abnormalities: Secondary | ICD-10-CM | POA: Diagnosis not present

## 2017-01-29 DIAGNOSIS — N200 Calculus of kidney: Secondary | ICD-10-CM | POA: Diagnosis not present

## 2017-01-29 MED ORDER — IOPAMIDOL (ISOVUE-300) INJECTION 61%: 100.0000 mL | Freq: Once | INTRAVENOUS | Status: DC | PRN

## 2017-02-14 ENCOUNTER — Other Ambulatory Visit: Payer: Self-pay | Admitting: Internal Medicine

## 2017-10-03 ENCOUNTER — Ambulatory Visit (INDEPENDENT_AMBULATORY_CARE_PROVIDER_SITE_OTHER): Payer: Medicare Other | Admitting: Internal Medicine

## 2017-10-03 ENCOUNTER — Encounter: Payer: Self-pay | Admitting: Internal Medicine

## 2017-10-03 ENCOUNTER — Other Ambulatory Visit: Payer: Self-pay | Admitting: Internal Medicine

## 2017-10-03 VITALS — BP 118/68 | HR 69 | Temp 98.0°F | Resp 14 | Ht 61.0 in | Wt 104.2 lb

## 2017-10-03 DIAGNOSIS — Z0000 Encounter for general adult medical examination without abnormal findings: Secondary | ICD-10-CM

## 2017-10-03 LAB — CBC WITH DIFFERENTIAL/PLATELET
BASOS ABS: 0 10*3/uL (ref 0.0–0.1)
BASOS PCT: 0.5 % (ref 0.0–3.0)
EOS ABS: 0 10*3/uL (ref 0.0–0.7)
Eosinophils Relative: 0.2 % (ref 0.0–5.0)
HCT: 41.3 % (ref 36.0–46.0)
Hemoglobin: 14.2 g/dL (ref 12.0–15.0)
LYMPHS ABS: 1.1 10*3/uL (ref 0.7–4.0)
Lymphocytes Relative: 13.6 % (ref 12.0–46.0)
MCHC: 34.4 g/dL (ref 30.0–36.0)
MCV: 97.6 fl (ref 78.0–100.0)
MONO ABS: 0.3 10*3/uL (ref 0.1–1.0)
Monocytes Relative: 3.1 % (ref 3.0–12.0)
NEUTROS ABS: 6.7 10*3/uL (ref 1.4–7.7)
NEUTROS PCT: 82.6 % — AB (ref 43.0–77.0)
PLATELETS: 163 10*3/uL (ref 150.0–400.0)
RBC: 4.23 Mil/uL (ref 3.87–5.11)
RDW: 12.9 % (ref 11.5–15.5)
WBC: 8.1 10*3/uL (ref 4.0–10.5)

## 2017-10-03 LAB — TSH: TSH: 0.57 u[IU]/mL (ref 0.35–4.50)

## 2017-10-03 MED ORDER — ESCITALOPRAM OXALATE 20 MG PO TABS: 20.0000 mg | ORAL_TABLET | Freq: Every day | ORAL | 6 refills | Status: DC

## 2017-10-03 NOTE — Patient Instructions (Signed)
GO TO THE LAB : Get the blood work     GO TO THE FRONT DESK Schedule your next appointment for a follow-up in 1 month  Please see a counselor  Increase Lexapro to 20 mg every day   SUICIDE PREVENTION:  Find Help 24/7, [place] Call our 24-hour HelpLine at [phone] or [place]: 1-800-SUICIDE   The National Suicide Prevention Lifeline: [employer]

## 2017-10-03 NOTE — Assessment & Plan Note (Signed)
Anxiety depression: lost her husband to CAD 05-2017, not grieving well, very depressed, no suicidal ideas.  We had a long conversation, I counseled to the best of my ability.  She has very good prognosis. Plan: I strongly recommend counseling, increase Lexapro to 20 mg, follow-up in 1 month, suicidal ideas discussed. Tobacco abuse: Patient is thinking about quitting, will reassess in 1 month Osteoporosis: 09-2016 T score was -2.6, improved from previous (-2.9).  Continue Fosamax Abnormal Lung Ca screening CT 09-2016, recheck CT 12-2016: findings resolved ; pulmonary rec annual CTs, next 12-2017 RTC 1 month

## 2017-10-03 NOTE — Progress Notes (Signed)
Pre visit review using our clinic review tool, if applicable. No additional management support is needed unless otherwise documented below in the visit note. 

## 2017-10-03 NOTE — Assessment & Plan Note (Signed)
-  Td 07-2013;PNM shot 07-2013; prevnar--2016; zostavax --09-2013; shingrix not available  --No further PAPs, see comments from 2017  --MMG-: 12-2016 neg  --Had a Cscope for bleeding 1999 d/t rectal bleed,cologuard neg 2015, + 10-2016: s/p cscope 12/2016, next per gi  --H/o palpable abdominal aorta, neg  Ultrasound 2015  --Labs: CMP, CBC, FLP, TSH Diet and exercise discussed

## 2017-10-03 NOTE — Progress Notes (Signed)
Subjective:    Patient ID: [name], female    DOB: 1945-11-10, 72 y.o.   MRN: [id number]  DOS:  10/03/2017 Type of visit - description : cpx Interval history: Since the last office visit, she lost her husband in November.  This has been extremely hard for the patient. She reports severe depression, requests help. She has a very supportive family, she has not reached out for formal counseling. She cries frequently, is avoiding to socialize, appetite is very poor, has lost weight. She sleeps without problems.  Wt Readings from Last 3 Encounters:  10/03/17 104 lb 4 oz (47.3 kg)  01/15/17 125 lb 8 oz (56.9 kg)  01/09/17 127 lb (57.6 kg)     Review of Systems  Other than above, a 14 point review of systems is negative      Past Medical History:  Diagnosis Date  . Anxiety   . Anxiety and depression 07/23/2013  . Depression    h/o early 68's- not currently  . Osteoporosis     Past Surgical History:  Procedure Laterality Date  . surgery     R arm and shoulder  . TUBAL LIGATION      Social History   Socioeconomic History  . Marital status: Widowed    Spouse name: Not on file  . Number of children: 0  . Years of education: Not on file  . Highest education level: Not on file  Occupational History  . Occupation: retired, Product/process development scientist  . Financial resource strain: Not on file  . Food insecurity:    Worry: Not on file    Inability: Not on file  . Transportation needs:    Medical: Not on file    Non-medical: Not on file  Tobacco Use  . Smoking status: Current Every Day Smoker    Packs/day: 0.75  . Smokeless tobacco: Never Used  . Tobacco comment: 1 ppd started 1987  Substance and Sexual Activity  . Alcohol use: No  . Drug use: No  . Sexual activity: Not on file  Lifestyle  . Physical activity:    Days per week: Not on file    Minutes per session: Not on file  . Stress: Not on file  Relationships  . Social connections:    Talks on phone:  Not on file    Gets together: Not on file    Attends religious service: Not on file    Active member of club or organization: Not on file    Attends meetings of clubs or organizations: Not on file    Relationship status: Not on file  . Intimate partner violence:    Fear of current or ex partner: Not on file    Emotionally abused: Not on file    Physically abused: Not on file    Forced sexual activity: Not on file  Other Topics Concern  . Not on file  Social History Narrative   [employer] education.   Lives in [place] x years   Lost husband [name])  [phone]: CAD, [place]    Father lives in [place]   2 brother, they are not local, [name] visits her q 2 weeks   [name] is her best friend          Family History  Problem Relation Age of Onset  . Breast cancer Mother        breast  . Dementia Mother   . CAD Other  MI GF age 9  . Diabetes Father   . Ovarian cancer Maternal Grandmother   . Colon cancer Neg Hx   . Colon polyps Neg Hx   . Esophageal cancer Neg Hx   . Rectal cancer Neg Hx   . Stomach cancer Neg Hx      Allergies as of 10/03/2017   No Known Allergies     Medication List        Accurate as of 10/03/17 11:19 PM. Always use your most recent med list.          alendronate 70 MG tablet Commonly known as:  FOSAMAX Take 1 tablet (70 mg total) by mouth once a week. Take on an empty stomach w/ full glass of water. Remain upright 30 mins.   cholecalciferol 1000 units tablet Commonly known as:  VITAMIN D Take 1,000 Units by mouth daily.   escitalopram 20 MG tablet Commonly known as:  LEXAPRO Take 1 tablet (20 mg total) by mouth daily.          Objective:   Physical Exam BP 118/68 (BP Location: Left Arm, Patient Position: Sitting, Cuff Size: Small)   Pulse 69   Temp 98 F (36.7 C) (Oral)   Resp 14   Ht 5\' 1"  (1.549 m)   Wt 104 lb 4 oz (47.3 kg)   SpO2 97%   BMI 19.70 kg/m  General:   Well developed, well nourished.  Tearful.  Slightly underweight  appearing  neck: No  thyromegaly  HEENT:  Normocephalic . Face symmetric, atraumatic Lungs:  CTA B Normal respiratory effort, no intercostal retractions, no accessory muscle use. Heart: RRR,  no murmur.  No pretibial edema bilaterally  Abdomen:  Not distended, soft, non-tender. No rebound or rigidity.   Skin: Exposed areas without rash. Not pale. Not jaundice Neurologic:  alert & oriented X3.  Speech normal, gait appropriate for age and unassisted Strength symmetric and appropriate for age.  Psych: Cognition and judgment appear intact.  Cooperative with normal attention span and concentration.  Behavior appropriate. Depressed appearing, tearful on and off throughout the visit.     Assessment & Plan:   Assessment Anxiety (fear of ridding a car), depression Osteoporosis t score -2.9 (09-2013). Started Fosamax 2015   Abnormal Lung Ca screening CT 09-2016, recheck CT 12-2016: findings resolved ; pulmonary rec annual CTs, next 12-2017  Plan Anxiety depression: lost her husband to CAD 05-2017, not grieving well, very depressed, no suicidal ideas.  We had a long conversation, I counseled to the best of my ability.  She has very good prognosis. Plan: I strongly recommend counseling, increase Lexapro to 20 mg, follow-up in 1 month, suicidal ideas discussed. Tobacco abuse: Patient is thinking about quitting, will reassess in 1 month Osteoporosis: 09-2016 T score was -2.6, improved from previous (-2.9).  Continue Fosamax Abnormal Lung Ca screening CT 09-2016, recheck CT 12-2016: findings resolved ; pulmonary rec annual CTs, next 12-2017 RTC 1 month

## 2017-10-04 LAB — COMPREHENSIVE METABOLIC PANEL
ALBUMIN: 4.5 g/dL (ref 3.5–5.2)
ALT: 8 U/L (ref 0–35)
AST: 13 U/L (ref 0–37)
Alkaline Phosphatase: 30 U/L — ABNORMAL LOW (ref 39–117)
BUN: 15 mg/dL (ref 6–23)
CALCIUM: 10.2 mg/dL (ref 8.4–10.5)
CHLORIDE: 104 meq/L (ref 96–112)
CO2: 29 meq/L (ref 19–32)
CREATININE: 0.84 mg/dL (ref 0.40–1.20)
GFR: 71.17 mL/min (ref >60.00)
Glucose, Bld: 93 mg/dL (ref 70–99)
POTASSIUM: 4.2 meq/L (ref 3.5–5.1)
SODIUM: 143 meq/L (ref 135–145)
Total Bilirubin: 0.4 mg/dL (ref 0.2–1.2)
Total Protein: 7.2 g/dL (ref 6.0–8.3)

## 2017-10-04 LAB — LIPID PANEL
CHOL/HDL RATIO: 2
CHOLESTEROL: 187 mg/dL (ref 0–200)
HDL: 83.9 mg/dL (ref >39.00)
LDL Cholesterol: 88 mg/dL (ref 0–99)
NonHDL: 103.46
TRIGLYCERIDES: 76 mg/dL (ref 0.0–149.0)
VLDL: 15.2 mg/dL (ref 0.0–40.0)

## 2017-10-24 ENCOUNTER — Other Ambulatory Visit: Payer: Self-pay | Admitting: Internal Medicine

## 2017-10-24 MED ORDER — ESCITALOPRAM OXALATE 20 MG PO TABS: 20.0000 mg | ORAL_TABLET | Freq: Every day | ORAL | 0 refills | Status: DC

## 2017-10-24 NOTE — Telephone Encounter (Signed)
Rx sent 

## 2017-10-24 NOTE — Telephone Encounter (Signed)
Copied from [place] [phone]. Topic: Quick Communication - Rx Refill/Question >> Oct 24, 2017  1:13 PM Ahmed Prima L wrote: Medication: escitalopram (LEXAPRO) 20 MG tablet Has the patient contacted their pharmacy? Yes but will be running out before her visit on the 25th. [facility] told her that they are showing its for 10mg  and not 20mg , she needs a new script [facility]. (Agent: If no, request that the patient contact the pharmacy for the refill.) Preferred Pharmacy (with phone number or street name): [place] [address] [place] Agent: Please be advised that RX refills may take up to 3 business days. We ask that you follow-up with your pharmacy.

## 2017-10-24 NOTE — Telephone Encounter (Signed)
Lexapro 20 mg tablet new Rx script being requested.  See attached note.  Pt of Dr. Larose Kells  [facility] [place] [place]

## 2017-11-06 ENCOUNTER — Ambulatory Visit: Payer: Self-pay | Admitting: Internal Medicine

## 2017-11-07 ENCOUNTER — Ambulatory Visit (INDEPENDENT_AMBULATORY_CARE_PROVIDER_SITE_OTHER): Payer: Medicare Other | Admitting: Internal Medicine

## 2017-11-07 ENCOUNTER — Encounter: Payer: Self-pay | Admitting: Internal Medicine

## 2017-11-07 VITALS — BP 118/74 | HR 62 | Temp 98.0°F | Resp 14 | Ht 61.0 in | Wt 105.0 lb

## 2017-11-07 DIAGNOSIS — F419 Anxiety disorder, unspecified: Secondary | ICD-10-CM

## 2017-11-07 DIAGNOSIS — F329 Major depressive disorder, single episode, unspecified: Secondary | ICD-10-CM

## 2017-11-07 NOTE — Patient Instructions (Addendum)
  GO TO THE FRONT DESK Schedule your next appointment for a   Physical by 09/2018 

## 2017-11-07 NOTE — Progress Notes (Signed)
Subjective:    Patient ID: [name], female    DOB: [birth year], 72 y.o.   MRN: [id number]  DOS:  11/07/2017 Type of visit - description : f/u Interval history: See last visit, she was depressed, Lexapro increased, feeling much improved. No apparent side effects. She gets a lot of help from her family, 1 of her 3 brothers visits her regularly.  Wt Readings from Last 3 Encounters:  11/07/17 105 lb (47.6 kg)  10/03/17 104 lb 4 oz (47.3 kg)  01/15/17 125 lb 8 oz (56.9 kg)     Review of Systems  Denies suicidal ideas Sleeps well She is trying to keep herself busy with puzzles  Past Medical History:  Diagnosis Date  . Anxiety   . Anxiety and depression 07/23/2013  . Depression    h/o early 73's- not currently  . Osteoporosis     Past Surgical History:  Procedure Laterality Date  . surgery     R arm and shoulder  . TUBAL LIGATION      Social History   Socioeconomic History  . Marital status: Widowed    Spouse name: Not on file  . Number of children: 0  . Years of education: Not on file  . Highest education level: Not on file  Occupational History  . Occupation: retired, Product/process development scientist  . Financial resource strain: Not on file  . Food insecurity:    Worry: Not on file    Inability: Not on file  . Transportation needs:    Medical: Not on file    Non-medical: Not on file  Tobacco Use  . Smoking status: Current Every Day Smoker    Packs/day: 0.75  . Smokeless tobacco: Never Used  . Tobacco comment: 1 ppd started 1987  Substance and Sexual Activity  . Alcohol use: No  . Drug use: No  . Sexual activity: Not on file  Lifestyle  . Physical activity:    Days per week: Not on file    Minutes per session: Not on file  . Stress: Not on file  Relationships  . Social connections:    Talks on phone: Not on file    Gets together: Not on file    Attends religious service: Not on file    Active member of club or organization: Not on file    Attends  meetings of clubs or organizations: Not on file    Relationship status: Not on file  . Intimate partner violence:    Fear of current or ex partner: Not on file    Emotionally abused: Not on file    Physically abused: Not on file    Forced sexual activity: Not on file  Other Topics Concern  . Not on file  Social History Narrative   [employer] education.   Lives in [place] x years   Lost husband [name])  [phone]: CAD, [place]    Father lives in [place]   2 brother, they are not local, [name] visits her q 2 weeks   [name] is her best friend           Allergies as of 11/07/2017   No Known Allergies     Medication List        Accurate as of 11/07/17 10:57 AM. Always use your most recent med list.          alendronate 70 MG tablet Commonly known as:  FOSAMAX Take 1 tablet (70 mg total) by mouth once  a week. Take on an empty stomach w/ full glass of water. Remain upright 30 mins.   cholecalciferol 1000 units tablet Commonly known as:  VITAMIN D Take 1,000 Units by mouth daily.   escitalopram 20 MG tablet Commonly known as:  LEXAPRO Take 1 tablet (20 mg total) by mouth daily.          Objective:   Physical Exam BP 118/74 (BP Location: Left Arm, Patient Position: Sitting, Cuff Size: Small)   Pulse 62   Temp 98 F (36.7 C) (Oral)   Resp 14   Ht 5\' 1"  (1.549 m)   Wt 105 lb (47.6 kg)   SpO2 96%   BMI 19.84 kg/m  General:   Well developed, well nourished . NAD.  HEENT:  Normocephalic . Face symmetric, atraumatic  Skin: Not pale. Not jaundice Neurologic:  alert & oriented X3.  Speech normal, gait appropriate for age and unassisted Psych--  Cognition and judgment appear intact.  Cooperative with normal attention span and concentration.  Behavior appropriate. No anxious or depressed appearing.      Assessment & Plan:   Assessment Anxiety (fear of ridding a car), depression Osteoporosis t score -2.9 (09-2013). Started Fosamax 2015   Abnormal Lung Ca screening CT 09-2016,  recheck CT 12-2016: findings resolved ; pulmonary rec annual CTs, next 12-2017 Does not drive  Plan Anxiety, depression: Lost her husband 05-2017, was seen last month, was having a very hard time, Lexapro increased, she is feeling much improved, she has good family support, no apparent side effects from medication. The patient is counseled again, I still recommend to seek formal counseling.  Also praised for keeping herself active, encouraged to start doing some walking and exercise. RTC 09-2018, CPX  (declined a sooner follow-up, knows to call me if needed)  F2F > 15 min, counseling, listening therapy

## 2017-11-07 NOTE — Progress Notes (Signed)
Pre visit review using our clinic review tool, if applicable. No additional management support is needed unless otherwise documented below in the visit note. 

## 2017-11-10 NOTE — Assessment & Plan Note (Signed)
Anxiety, depression: Lost her husband 05-2017, was seen last month, was having a very hard time, Lexapro increased, she is feeling much improved, she has good family support, no apparent side effects from medication. The patient is counseled again, I still recommend to seek formal counseling.  Also praised for keeping herself active, encouraged to start doing some walking and exercise. RTC 09-2018, CPX  (declined a sooner follow-up, knows to call me if needed)

## 2017-12-08 ENCOUNTER — Other Ambulatory Visit: Payer: Self-pay | Admitting: Internal Medicine

## 2017-12-12 ENCOUNTER — Other Ambulatory Visit: Payer: Self-pay | Admitting: Internal Medicine

## 2018-09-13 ENCOUNTER — Other Ambulatory Visit: Payer: Self-pay | Admitting: Internal Medicine

## 2018-10-06 ENCOUNTER — Encounter: Payer: Self-pay | Admitting: Internal Medicine

## 2018-10-28 ENCOUNTER — Encounter: Payer: Self-pay | Admitting: Internal Medicine

## 2018-11-03 ENCOUNTER — Encounter: Payer: Self-pay | Admitting: Internal Medicine

## 2018-11-03 NOTE — Telephone Encounter (Signed)
Opened in error

## 2018-11-26 ENCOUNTER — Other Ambulatory Visit: Payer: Self-pay | Admitting: Internal Medicine

## 2018-11-30 ENCOUNTER — Other Ambulatory Visit: Payer: Self-pay | Admitting: Internal Medicine

## 2019-01-01 IMAGING — CT CT CHEST LUNG CANCER SCREENING LOW DOSE W/O CM
2 of 5 series · 15 of 40 positions shown, 18 images · non-contrast
Comparison: None.

CLINICAL DATA: Thirty-one pack-year smoking history. Asymptomatic
current smoker.

EXAM:
CT CHEST WITHOUT CONTRAST LOW-DOSE FOR LUNG CANCER SCREENING
TECHNIQUE: Multidetector CT imaging of the chest was performed following the
standard protocol without IV contrast.

[Series 3: lungs · axial · 0.64mm/px · z∈[-302,-22]mm · 12 of 309 slices shown, 15 images]
[im 15/309  mediastinal]
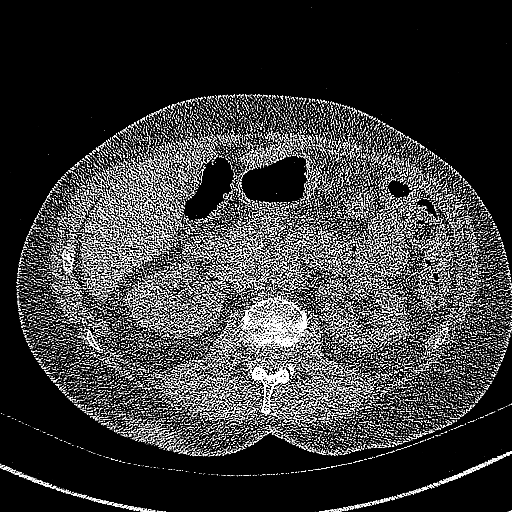
[im 15/309  lung]
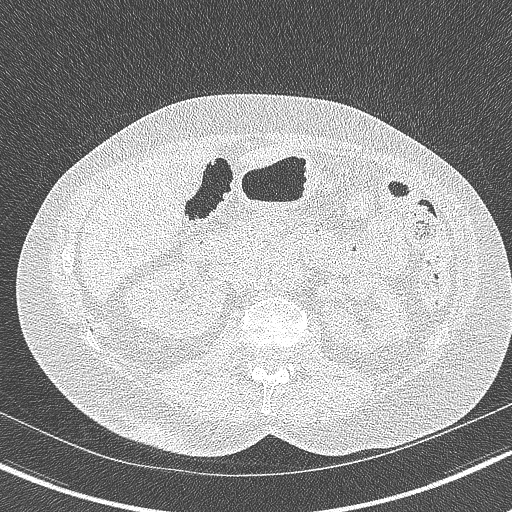
[im 43/309  lung]
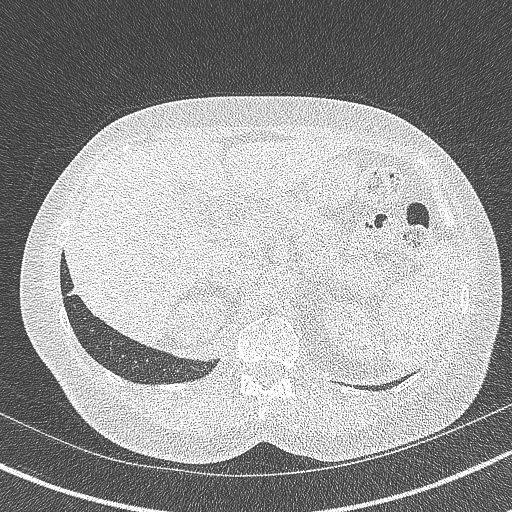
[im 71/309  lung]
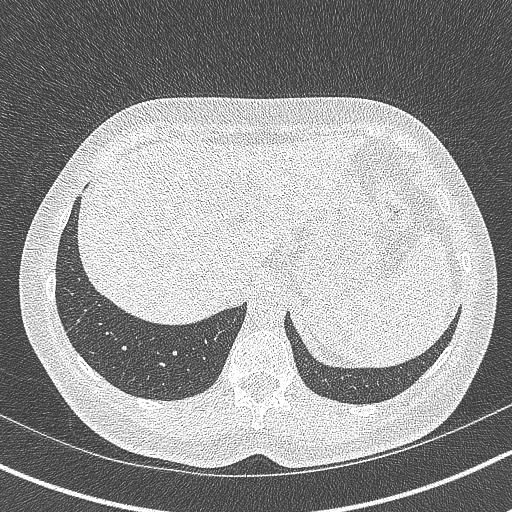
[im 99/309  lung]
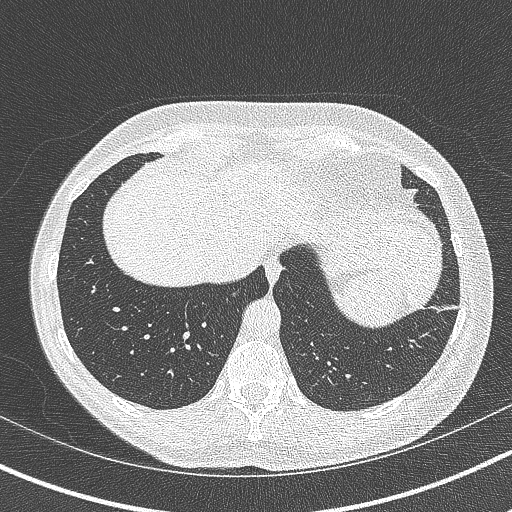
[im 113/309  mediastinal]
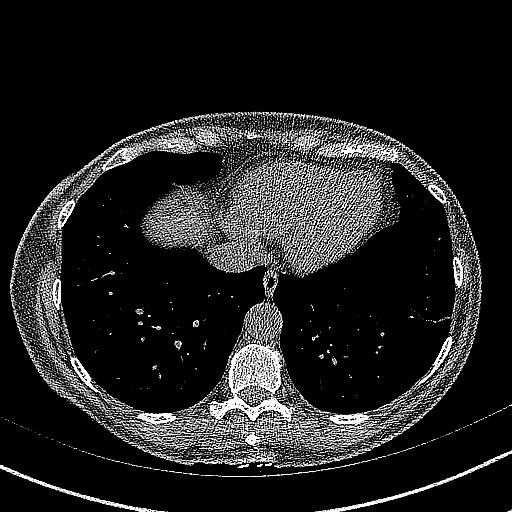
[im 113/309  lung]
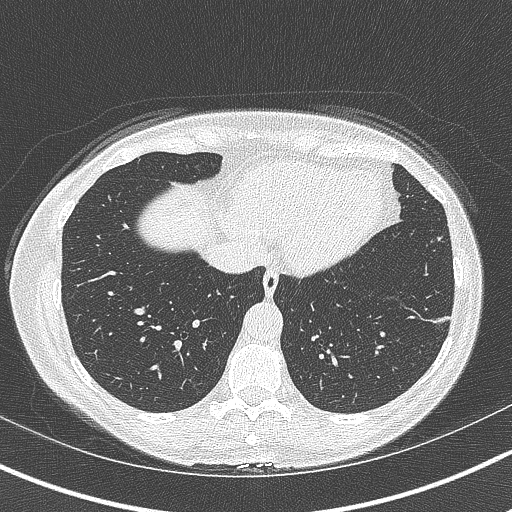
[im 141/309  lung]
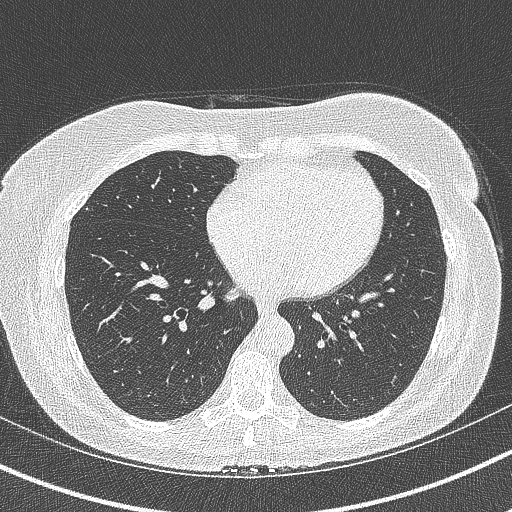
[im 169/309  lung]
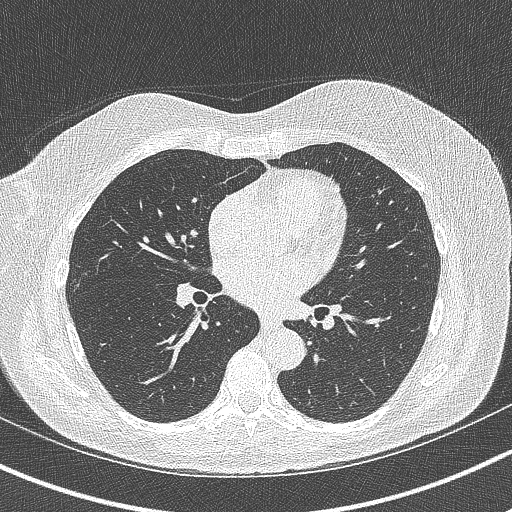
[im 197/309  lung]
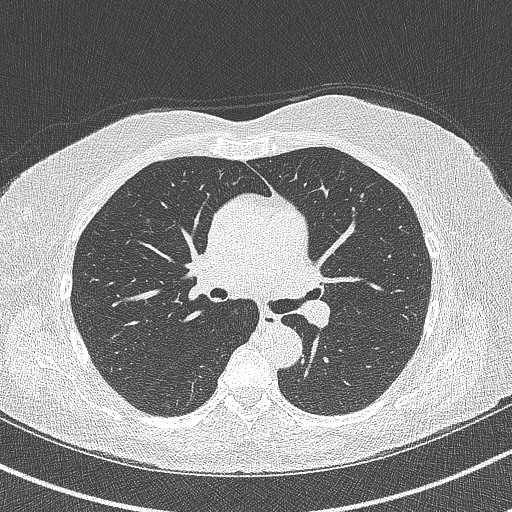
[im 211/309  mediastinal]
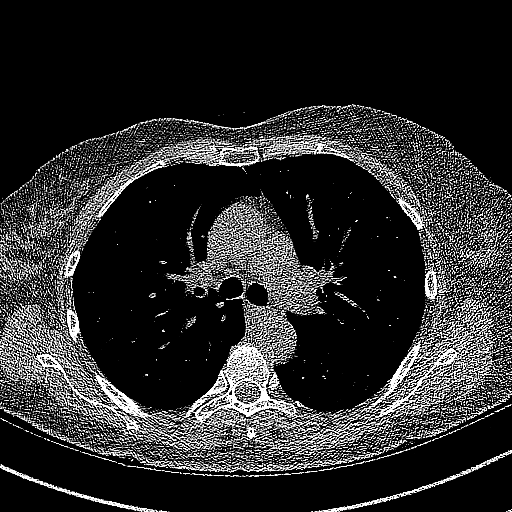
[im 211/309  lung]
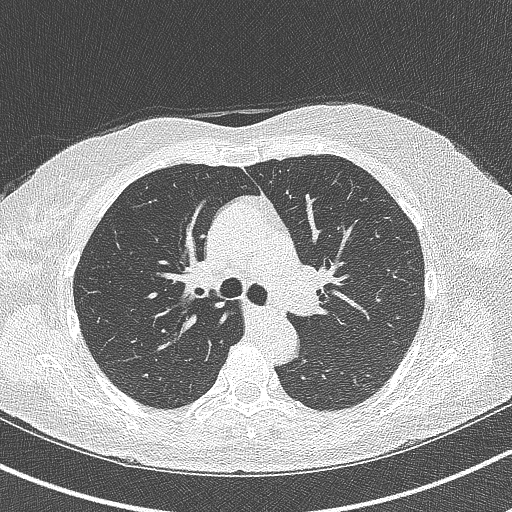
[im 239/309  lung]
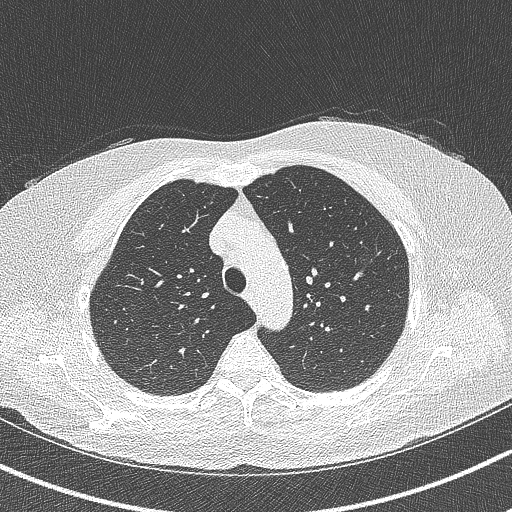
[im 267/309  lung]
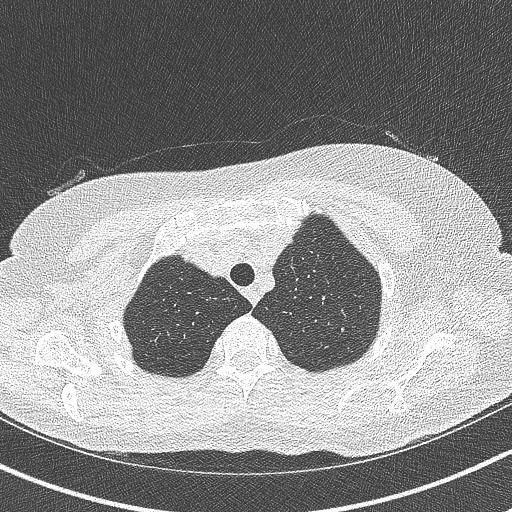
[im 295/309  lung]
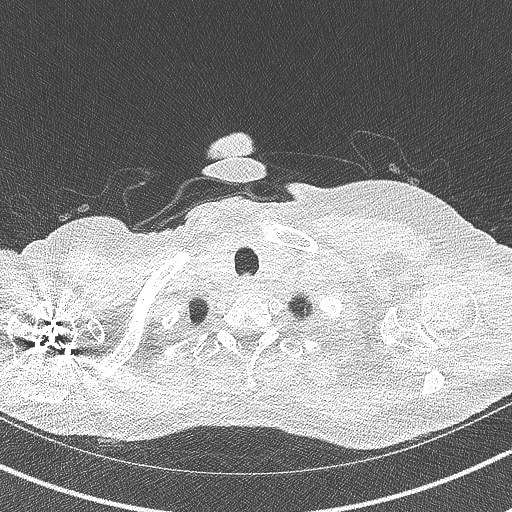

[Series 4: coronal · coronal · 0.61mm/px · 3 of 257 slices shown]
[im 52/257  lung]
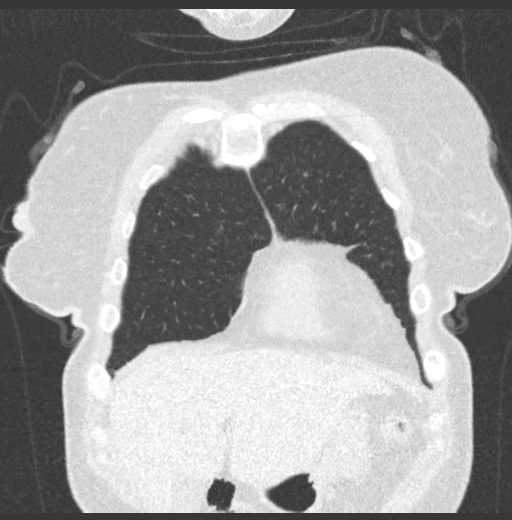
[im 103/257  lung]
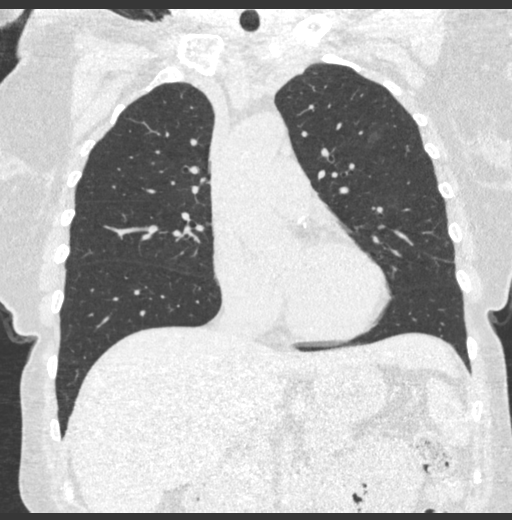
[im 154/257  lung]
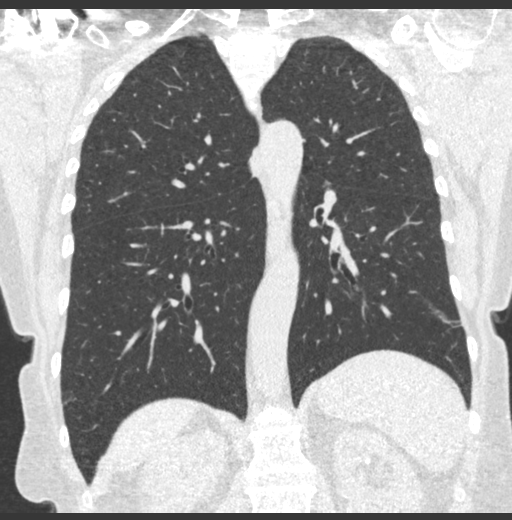

[15 of 40 positions shown; findings below may reference images not displayed]

FINDINGS: Cardiovascular: Aortic and branch vessel atherosclerosis. Tortuous
thoracic aorta. Normal heart size, without pericardial effusion. Lad
coronary artery atherosclerosis.

Mediastinum/Nodes: No mediastinal or definite hilar adenopathy,
given limitations of unenhanced CT.

Lungs/Pleura: No pleural fluid. Mild centrilobular emphysema.
Probable secretions in the dependent bronchus intermedius.

Bilateral part solid upper lobe pulmonary nodules. The larger is in
the right upper lobe and measures volume derived equivalent diameter
10.8 mm. The solid component measures volume derived equivalent
diameter 6.1 mm. Example image 79/ series 3.

Left upper lobe endobronchial nodule versus area of mucous plugging
measures volume derived equivalent diameter 4.9 mm on image
103/series 3.

Other smaller solid pulmonary nodules.

Upper Abdomen: Normal imaged portions of the liver, spleen, stomach,
pancreas, gallbladder, biliary tract, adrenal glands. Multiple
bilateral renal collecting system calculi, maximally 6 mm in the
upper pole right kidney.

Musculoskeletal: Proximal right humerus fixation.
IMPRESSION: 1. Lung-RADS Category 4A, suspicious. Follow up low-dose chest CT
without contrast in 3 months (please use the following order, "CT
CHEST LCS NODULE FOLLOW-UP W/O CM") is recommended. Alternatively,
PET may be considered when there is a solid component 8mm or larger.
Suspicious findings include part solid bilateral upper lobe
pulmonary nodules and a possible left upper lobe endobronchial
nodule.
2.  Coronary artery atherosclerosis. Aortic atherosclerosis.
3. Bilateral nephrolithiasis.
These results will be called to the ordering clinician or
representative by the Radiologist Assistant, and communication
documented in the PACS or zVision Dashboard.

## 2019-01-14 ENCOUNTER — Encounter: Payer: Self-pay | Admitting: Internal Medicine

## 2019-02-15 ENCOUNTER — Other Ambulatory Visit: Payer: Self-pay | Admitting: Internal Medicine

## 2019-03-19 ENCOUNTER — Encounter: Payer: Medicare Other | Admitting: Internal Medicine

## 2019-04-30 IMAGING — CT CT ABD-PELV W/ CM
2 of 5 series · 16 of 46 positions shown, 18 images · IV contrast (ISOVUE 300)
Comparison: 06/11/2008

CLINICAL DATA: Small subepithelial lesion noted in the cecum on
colonoscopy 01/09/2017.

EXAM:
CT ABDOMEN AND PELVIS WITH CONTRAST
TECHNIQUE: Multidetector CT imaging of the abdomen and pelvis was performed
using the standard protocol following bolus administration of
intravenous contrast.
CONTRAST:  100 cc [id number]

[Series 2: abd/pel w · axial · 0.74mm/px · z∈[+696,+1066]mm · 13 of 84 slices shown, 15 images]
[im 5/84  soft-tissue]
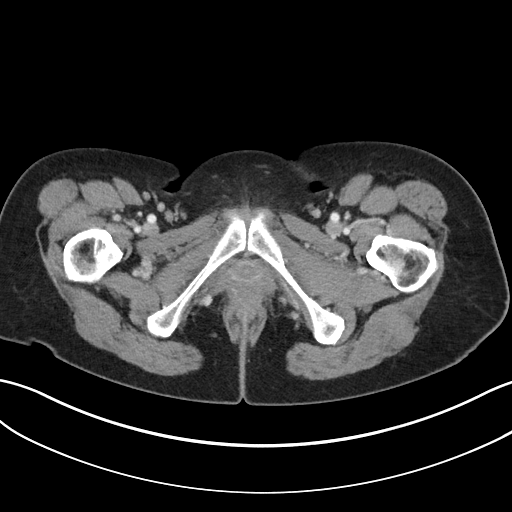
[im 5/84  bone]
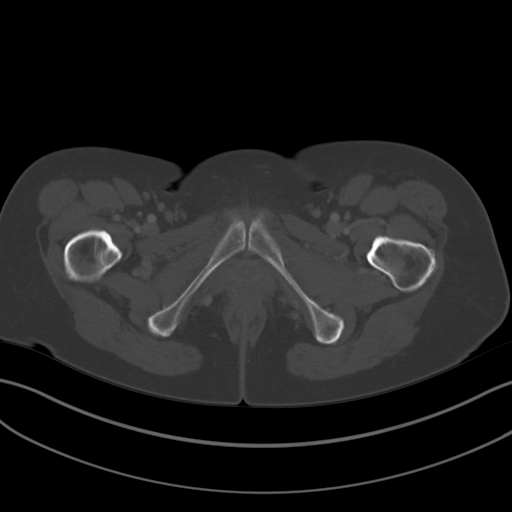
[im 10/84  soft-tissue]
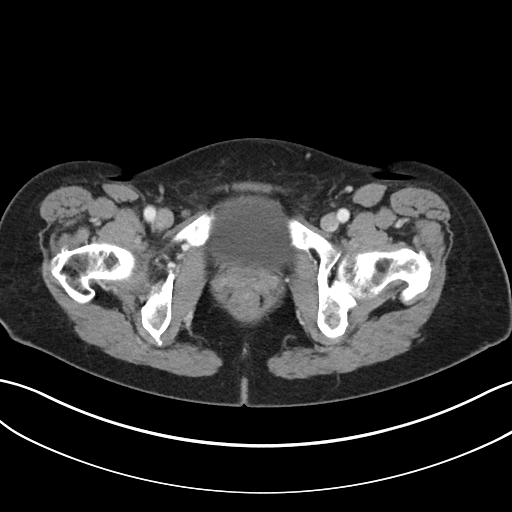
[im 19/84  soft-tissue]
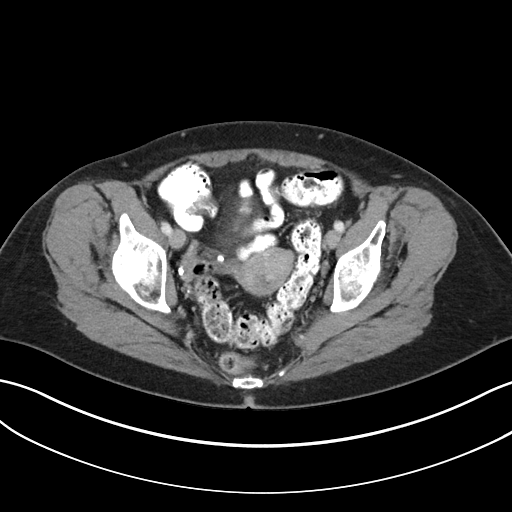
[im 24/84  soft-tissue]
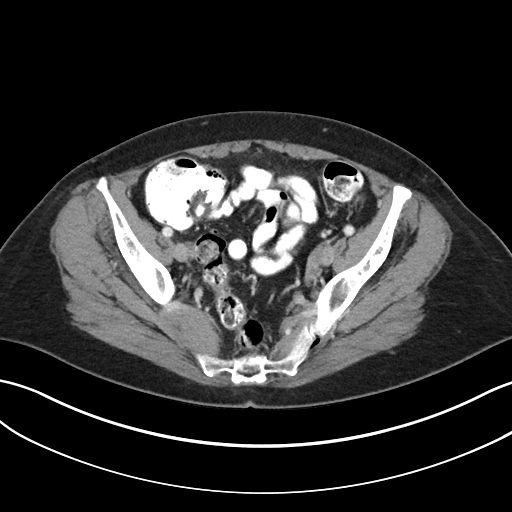
[im 28/84  soft-tissue]
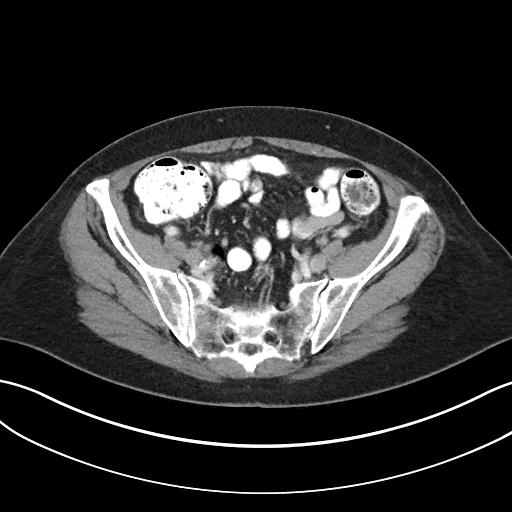
[im 37/84  soft-tissue]
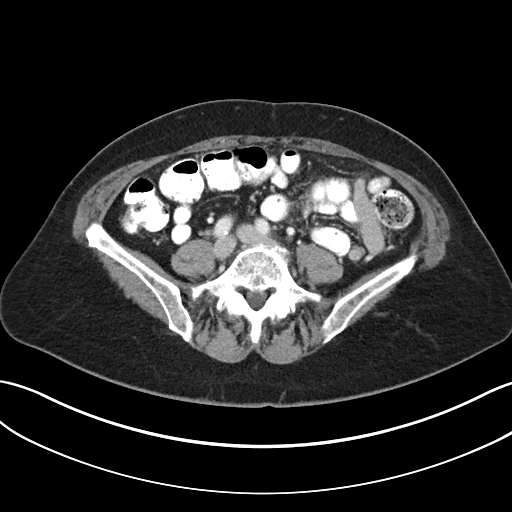
[im 42/84  soft-tissue]
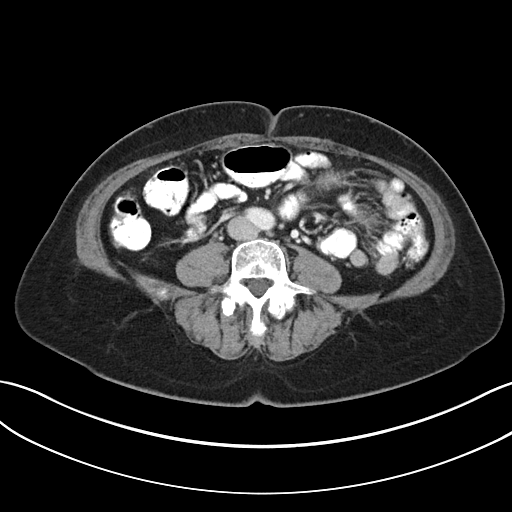
[im 47/84  soft-tissue]
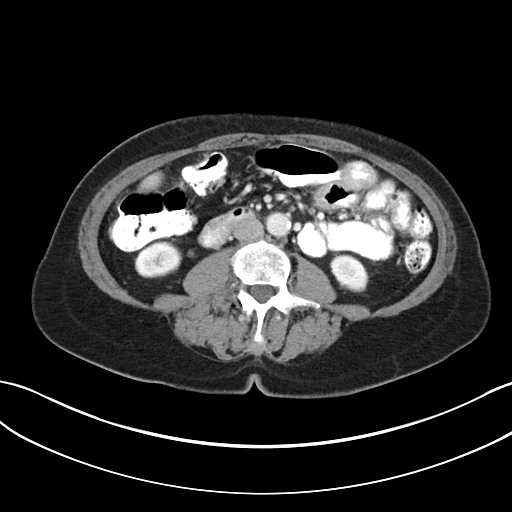
[im 56/84  soft-tissue]
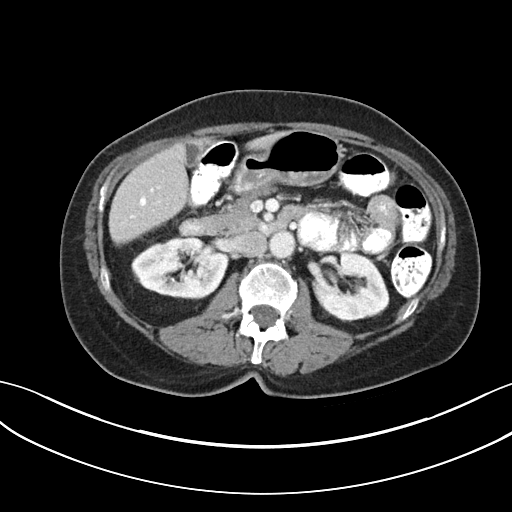
[im 56/84  bone]
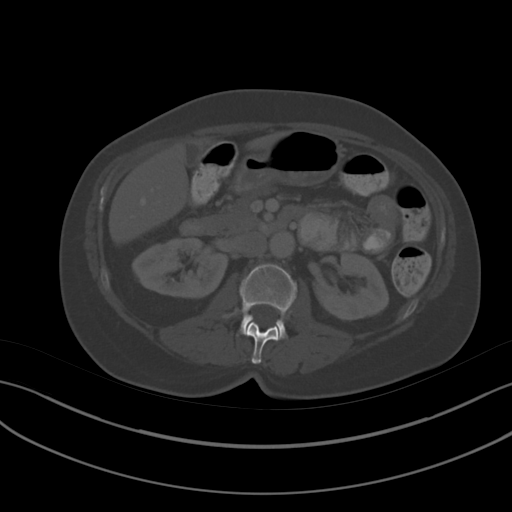
[im 60/84  soft-tissue]
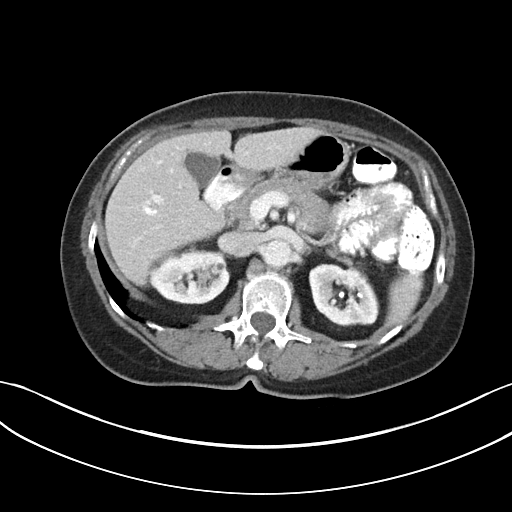
[im 65/84  soft-tissue]
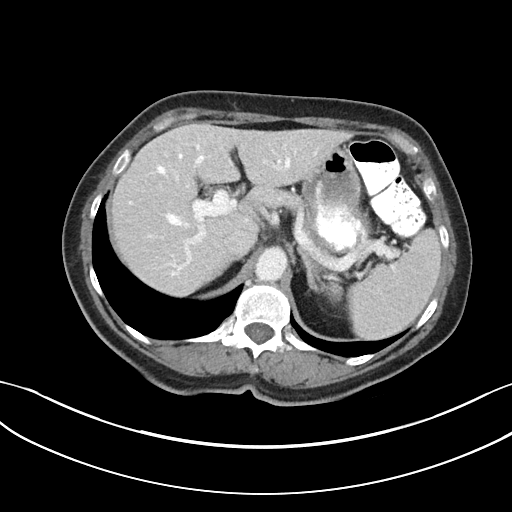
[im 74/84  soft-tissue]
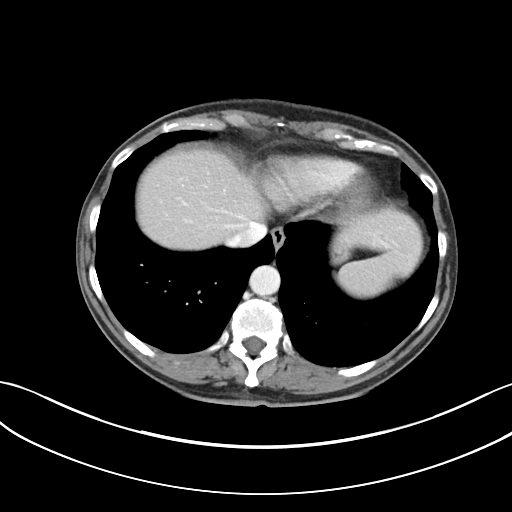
[im 79/84  soft-tissue]
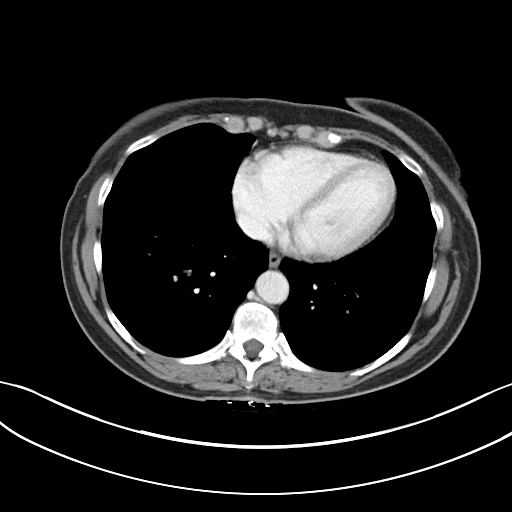

[Series 5: abd/pel w st · coronal · 0.79mm/px · 3 of 80 slices shown]
[im 27/80  soft-tissue]
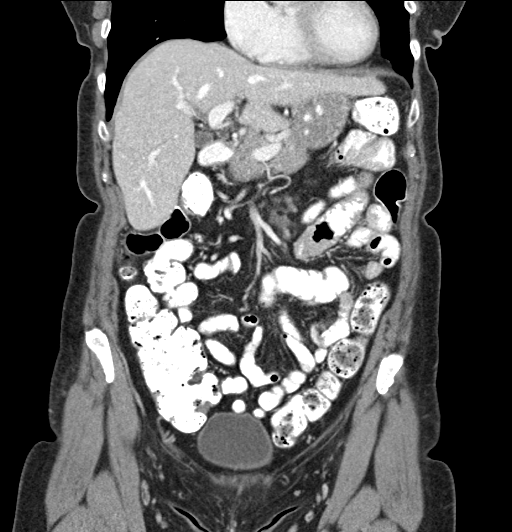
[im 36/80  soft-tissue]
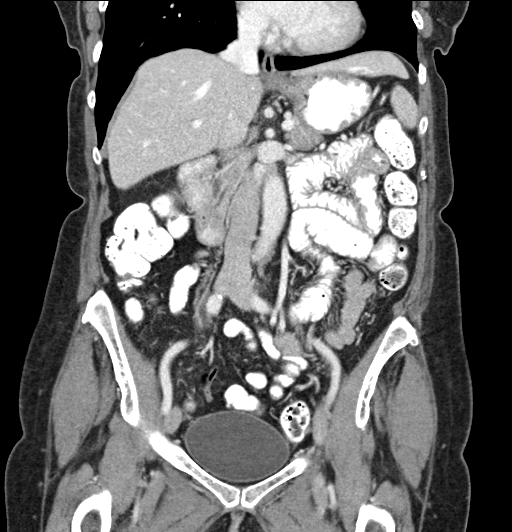
[im 44/80  soft-tissue]
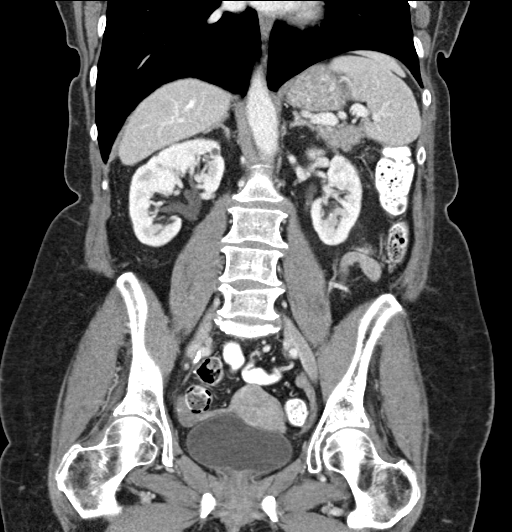

[16 of 46 positions shown; findings below may reference images not displayed]

FINDINGS: Lower chest:  Unremarkable.

Hepatobiliary: No focal abnormality within the liver parenchyma.
There is no evidence for gallstones, gallbladder wall thickening, or
pericholecystic fluid. No intrahepatic or extrahepatic biliary
dilation.

Pancreas: No focal mass lesion. No dilatation of the main duct. No
intraparenchymal cyst. No peripancreatic edema.

Spleen: No splenomegaly. No focal mass lesion.

Adrenals/Urinary Tract: No adrenal nodule or mass. Multiple
nonobstructing stones identified in the right kidney measuring up to
4 x 7 mm in the upper pole. Multiple nonobstructing stones are also
noted in the left kidney with dominant stones seen in the upper pole
measuring 3 mm. Cortical scarring is evident and kidneys bilaterally
and small cortical hypoattenuating lesions in each kidney are likely
cysts. No evidence for hydroureter. The urinary bladder appears
normal for the degree of distention.

Stomach/Bowel: Stomach is nondistended. No gastric wall thickening.
No evidence of outlet obstruction. Duodenum is normally positioned
as is the ligament of Treitz. No small bowel dilatation. Scattered
areas of apparent small bowel wall thickening change in appearance
between portal venous and renal delayed imaging suggesting
peristalsis as the etiology. The terminal ileum is normal. The
appendix is normal. No discrete mass lesion is visualized in the
cecum. No gross colonic mass. No colonic wall thickening. No
substantial diverticular change.

Vascular/Lymphatic: There is abdominal aortic atherosclerosis
without aneurysm. There is no gastrohepatic or hepatoduodenal
ligament lymphadenopathy. No intraperitoneal or retroperitoneal
lymphadenopathy. No pelvic sidewall lymphadenopathy. No pelvic
sidewall lymphadenopathy.

Reproductive: Probable fibroids in the uterus. There is no adnexal
mass.

Other: No intraperitoneal free fluid.

Musculoskeletal: Bone windows reveal no worrisome lytic or sclerotic
osseous lesions.
IMPRESSION: 1. No cecal lesion in this patient with a subepithelial lesion on
recent colonoscopy.
2. Bilateral nonobstructing renal stones with cortical scarring in
the kidneys bilaterally.
3. Fibroid change in the uterus.
4.  Aortic Atherosclerois ([id number]-170.0)

## 2019-06-18 ENCOUNTER — Other Ambulatory Visit: Payer: Self-pay | Admitting: Internal Medicine

## 2019-06-18 DIAGNOSIS — Z1231 Encounter for screening mammogram for malignant neoplasm of breast: Secondary | ICD-10-CM

## 2019-06-22 ENCOUNTER — Encounter: Payer: Medicare Other | Admitting: Internal Medicine

## 2019-10-12 ENCOUNTER — Encounter: Payer: Medicare Other | Admitting: Internal Medicine

## 2019-11-07 ENCOUNTER — Other Ambulatory Visit: Payer: Self-pay | Admitting: Internal Medicine

## 2019-11-17 ENCOUNTER — Encounter: Payer: Self-pay | Admitting: Gastroenterology

## 2019-11-27 ENCOUNTER — Telehealth: Payer: Self-pay | Admitting: Internal Medicine

## 2019-11-27 NOTE — Telephone Encounter (Signed)
Last OV 10/2017- she is scheduled for visit on 01/05/2020 but needs to be sooner for refills.

## 2019-11-27 NOTE — Telephone Encounter (Signed)
Medication: alendronate (FOSAMAX) 70 MG tablet [id number]   Has the patient contacted their pharmacy? No. (If no, request that the patient contact the pharmacy for the refill.) (If yes, when and what did the pharmacy advise?)  Preferred Pharmacy (with phone number or street name) [place], [place] [facility]  [address] [place] [address]  Phone:  [phone] Fax:  [phone]  DEA #:  -- Agent: Please be advised that RX refills may take up to 3 business days. We ask that you follow-up with your pharmacy.

## 2019-12-02 ENCOUNTER — Telehealth: Payer: Self-pay

## 2019-12-02 NOTE — Telephone Encounter (Signed)
Patient called in to get a prescription refill for alendronate (FOSAMAX) 70 MG tablet [id number]    Please send it to [place], [id number], [place] [address]  Phone:  [phone] Fax:  [phone]  DEA #:  --

## 2019-12-02 NOTE — Telephone Encounter (Signed)
Last OV 10/2017- she is scheduled for visit on 01/05/2020 but needs to be sooner for refills.

## 2019-12-02 NOTE — Telephone Encounter (Signed)
Spoke w/ Pt- informed that we can't refill medication- she has not been seen since 10/2017- offered her an earlier appt but was unable to come sooner. Informed we would have to wait until her visit. Pt verbalized understanding.

## 2020-01-05 ENCOUNTER — Other Ambulatory Visit: Payer: Self-pay

## 2020-01-05 ENCOUNTER — Encounter: Payer: Self-pay | Admitting: Internal Medicine

## 2020-01-05 ENCOUNTER — Ambulatory Visit (INDEPENDENT_AMBULATORY_CARE_PROVIDER_SITE_OTHER): Payer: Medicare Other | Admitting: Internal Medicine

## 2020-01-05 VITALS — BP 117/68 | HR 67 | Temp 98.7°F | Resp 16 | Ht 61.0 in | Wt 103.2 lb

## 2020-01-05 DIAGNOSIS — F32A Depression, unspecified: Secondary | ICD-10-CM

## 2020-01-05 DIAGNOSIS — Z0000 Encounter for general adult medical examination without abnormal findings: Secondary | ICD-10-CM | POA: Diagnosis not present

## 2020-01-05 DIAGNOSIS — F419 Anxiety disorder, unspecified: Secondary | ICD-10-CM

## 2020-01-05 DIAGNOSIS — F329 Major depressive disorder, single episode, unspecified: Secondary | ICD-10-CM | POA: Diagnosis not present

## 2020-01-05 LAB — COMPREHENSIVE METABOLIC PANEL
ALT: 9 U/L (ref 0–35)
AST: 16 U/L (ref 0–37)
Albumin: 4.5 g/dL (ref 3.5–5.2)
Alkaline Phosphatase: 41 U/L (ref 39–117)
BUN: 12 mg/dL (ref 6–23)
CO2: 34 mEq/L — ABNORMAL HIGH (ref 19–32)
Calcium: 9.8 mg/dL (ref 8.4–10.5)
Chloride: 101 mEq/L (ref 96–112)
Creatinine, Ser: 0.81 mg/dL (ref 0.40–1.20)
GFR: 69.39 mL/min (ref >60.00)
Glucose, Bld: 85 mg/dL (ref 70–99)
Potassium: 4.3 mEq/L (ref 3.5–5.1)
Sodium: 140 mEq/L (ref 135–145)
Total Bilirubin: 0.6 mg/dL (ref 0.2–1.2)
Total Protein: 6.8 g/dL (ref 6.0–8.3)

## 2020-01-05 LAB — CBC WITH DIFFERENTIAL/PLATELET
Basophils Absolute: 0 10*3/uL (ref 0.0–0.1)
Basophils Relative: 0.6 % (ref 0.0–3.0)
Eosinophils Absolute: 0 10*3/uL (ref 0.0–0.7)
Eosinophils Relative: 0.3 % (ref 0.0–5.0)
HCT: 39.6 % (ref 36.0–46.0)
Hemoglobin: 13.5 g/dL (ref 12.0–15.0)
Lymphocytes Relative: 17.2 % (ref 12.0–46.0)
Lymphs Abs: 1.2 10*3/uL (ref 0.7–4.0)
MCHC: 34.1 g/dL (ref 30.0–36.0)
MCV: 99 fl (ref 78.0–100.0)
Monocytes Absolute: 0.3 10*3/uL (ref 0.1–1.0)
Monocytes Relative: 4.9 % (ref 3.0–12.0)
Neutro Abs: 5.4 10*3/uL (ref 1.4–7.7)
Neutrophils Relative %: 77 % (ref 43.0–77.0)
Platelets: 169 10*3/uL (ref 150.0–400.0)
RBC: 4 Mil/uL (ref 3.87–5.11)
RDW: 13.9 % (ref 11.5–15.5)
WBC: 7 10*3/uL (ref 4.0–10.5)

## 2020-01-05 LAB — LIPID PANEL
Cholesterol: 164 mg/dL (ref 0–200)
HDL: 76.3 mg/dL (ref >39.00)
LDL Cholesterol: 75 mg/dL (ref 0–99)
NonHDL: 87.49
Total CHOL/HDL Ratio: 2
Triglycerides: 61 mg/dL (ref 0.0–149.0)
VLDL: 12.2 mg/dL (ref 0.0–40.0)

## 2020-01-05 NOTE — Progress Notes (Signed)
Subjective:    Patient ID: [name], female    DOB: 09/09/1945, 74 y.o.   MRN: [id number]  DOS:  01/05/2020 Type of visit - description: CPX Last visit was in 2019. In general doing okay. She lost her husband 2018, still not fully recovered, see a/p  Wt Readings from Last 3 Encounters:  01/05/20 103 lb 4 oz (46.8 kg)  11/07/17 105 lb (47.6 kg)  10/03/17 104 lb 4 oz (47.3 kg)    Review of Systems  Other than above, a 14 point review of systems is negative    Past Medical History:  Diagnosis Date  . Anxiety   . Anxiety and depression 07/23/2013  . Depression    h/o early 82's- not currently  . Osteoporosis     Past Surgical History:  Procedure Laterality Date  . surgery     R arm and shoulder  . TUBAL LIGATION      Allergies as of 01/05/2020   No Known Allergies     Medication List       Accurate as of January 05, 2020 11:59 PM. If you have any questions, ask your nurse or doctor.        STOP taking these medications   alendronate 70 MG tablet Commonly known as: FOSAMAX Stopped by: Kathlene November, MD     TAKE these medications   cholecalciferol 1000 units tablet Commonly known as: VITAMIN D Take 1,000 Units by mouth daily.   escitalopram 20 MG tablet Commonly known as: LEXAPRO Take 1 tablet (20 mg total) by mouth daily.          Objective:   Physical Exam BP 117/68 (BP Location: Left Arm, Patient Position: Sitting, Cuff Size: Small)   Pulse 67   Temp 98.7 F (37.1 C) (Temporal)   Resp 16   Ht 5\' 1"  (1.549 m)   Wt 103 lb 4 oz (46.8 kg)   SpO2 97%   BMI 19.51 kg/m  General: Well developed, NAD, BMI noted Neck: No  thyromegaly  HEENT:  Normocephalic . Face symmetric, atraumatic Lungs:  CTA B Normal respiratory effort, no intercostal retractions, no accessory muscle use. Heart: RRR,  no murmur.  Abdomen:  Not distended, soft, non-tender. No rebound or rigidity.  Palpable, nontender aorta, no bruits Lower extremities: no pretibial edema  bilaterally  Skin: Exposed areas without rash. Not pale. Not jaundice Neurologic:  alert & oriented X3.  Speech normal, gait appropriate for age and unassisted Strength symmetric and appropriate for age.  Psych: Cognition and judgment appear intact.  Cooperative with normal attention span and concentration.  Behavior appropriate. No anxious or depressed appearing.     Assessment     Assessment Anxiety (fear of ridding a car), depression Osteoporosis t score -2.9 (09-2013). Started Fosamax 2015   Abnormal Lung Ca screening CT 09-2016, recheck CT 12-2016: findings resolved ; pulmonary rec annual CTs, next 12-2017 Does not drive   PLAN Here for CPX Anxiety, depression: Currently on Lexapro, lost her husband in 2018, still missing him tremendously but denies feeling depressed or tearful.  PHQ-9 is actually 0.  Not interested in counseling Osteoporosis: Started Fosamax in 2015, DC Fosamax (holiday) and recheck a DEXA next year. Abnormal CT lung/lung ca screening: Labs CAT scan was 2018, declined need to reschedule.  Will call if become interested. Social: Lives by herself, does not have a car or drive, depends on family and friends for transportation. RTC 1 year   This visit occurred during the  SARS-CoV-2 public health emergency.  Safety protocols were in place, including screening questions prior to the visit, additional usage of staff PPE, and extensive cleaning of exam room while observing appropriate contact time as indicated for disinfecting solutions.

## 2020-01-05 NOTE — Progress Notes (Signed)
Pre visit review using our clinic review tool, if applicable. No additional management support is needed unless otherwise documented below in the visit note. 

## 2020-01-05 NOTE — Patient Instructions (Addendum)
Please schedule Medicare Wellness with Glenard Haring.   COVID-19 Vaccine Information can be found at: [email or web address] For questions related to vaccine distribution or appointments, please email [email or web address] or call [phone].   Please think about proceeding with: Your Covid vaccination A flu shot this fall A Colonoscopy   You can stop Fosamax We will schedule a bone density test next year   GO TO THE LAB : Get the blood work     [place], [place] back for   a physical exam in 1 year

## 2020-01-06 ENCOUNTER — Encounter: Payer: Self-pay | Admitting: Internal Medicine

## 2020-01-06 NOTE — Assessment & Plan Note (Signed)
-  Td 07-2013 -PNM shot 07-2013; prevnar--2016 - zostavax --09-2013 - covid vaccine: hesitant, d/w pt  Pro>>cons  - shingrix : will discuss next year  --No further PAPs, see comments from 2017  --MMG-: 12-2016 per [facility] , declined a MMG, no FH --Had a Cscope for bleeding 1999 d/t rectal bleed,cologuard neg 2015, + 10-2016: s/p cscope 12/2016, due for colonoscopy per GI letter which is printed for the patient.  Encouraged to call them but she is reluctant --H/o palpable abdominal aorta, neg  Ultrasound 2015 - Labs:  CMP, FLP, CBC.  Pain- Diet and exercise discussed

## 2020-01-06 NOTE — Assessment & Plan Note (Signed)
Here for CPX Anxiety, depression: Currently on Lexapro, lost her husband in 2018, still missing him tremendously but denies feeling depressed or tearful.  PHQ-9 is actually 0.  Not interested in counseling Osteoporosis: Started Fosamax in 2015, DC Fosamax (holiday) and recheck a DEXA next year. Abnormal CT lung/lung ca screening: Labs CAT scan was 2018, declined need to reschedule.  Will call if become interested. Social: Lives by herself, does not have a car or drive, depends on family and friends for transportation. RTC 1 year

## 2020-01-26 ENCOUNTER — Other Ambulatory Visit: Payer: Self-pay | Admitting: Internal Medicine

## 2020-02-15 ENCOUNTER — Telehealth: Payer: Self-pay | Admitting: Internal Medicine

## 2020-02-15 NOTE — Telephone Encounter (Signed)
Left message for patient to call back and schedule Medicare Annual Wellness Visit (AWV) with Nurse Health Advisor   This should be a 15 MINUTE VISIT.  Last AWV 09/24/14

## 2020-02-16 NOTE — Progress Notes (Signed)
I connected with [name] today by telephone and verified that I am speaking with the correct person using two identifiers. Location patient: home Location provider: work Persons participating in the virtual visit: patient, Marine scientist.    I discussed the limitations, risks, security and privacy concerns of performing an evaluation and management service by telephone and the availability of in person appointments. I also discussed with the patient that there may be a patient responsible charge related to this service. The patient expressed understanding and verbally consented to this telephonic visit.    Interactive audio and video telecommunications were attempted between this provider and patient, however failed, due to patient having technical difficulties OR patient did not have access to video capability.  We continued and completed visit with audio only.  Some vital signs may be absent or patient reported.   Subjective:   [name] is a 74 y.o. female who presents for Medicare Annual (Subsequent) preventive examination.  Review of Systems    Cardiac Risk Factors include: advanced age (>38men, >40 women);smoking/ tobacco exposure     Objective:     Advanced Directives 02/17/2020 12/26/2016  Does Patient Have a Medical Advance Directive? Yes No  Type of Paramedic of [place];Living will -  Does patient want to make changes to medical advance directive? No - Patient declined -  Copy of [place] in Chart? No - copy requested -    Current Medications (verified) Outpatient Encounter Medications as of 02/17/2020  Medication Sig  . cholecalciferol (VITAMIN D) 1000 UNITS tablet Take 1,000 Units by mouth daily.   Marland Kitchen escitalopram (LEXAPRO) 20 MG tablet Take 1 tablet (20 mg total) by mouth daily.   No facility-administered encounter medications on file as of 02/17/2020.    Allergies (verified) Patient has no known allergies.   History: Past  Medical History:  Diagnosis Date  . Anxiety   . Anxiety and depression 07/23/2013  . Depression    h/o early 9's- not currently  . Osteoporosis    Past Surgical History:  Procedure Laterality Date  . surgery     R arm and shoulder  . TUBAL LIGATION     Family History  Problem Relation Age of Onset  . Breast cancer Mother   . Dementia Mother   . CAD Other        MI GF age 53  . Diabetes Father   . Ovarian cancer Maternal Grandmother   . Colon cancer Neg Hx   . Colon polyps Neg Hx   . Esophageal cancer Neg Hx   . Rectal cancer Neg Hx   . Stomach cancer Neg Hx    Social History   Socioeconomic History  . Marital status: Widowed    Spouse name: Not on file  . Number of children: 0  . Years of education: Not on file  . Highest education level: Not on file  Occupational History  . Occupation: retired, catering   Tobacco Use  . Smoking status: Current Every Day Smoker    Packs/day: 0.75  . Smokeless tobacco: Never Used  . Tobacco comment: pt states she has materials.  Substance and Sexual Activity  . Alcohol use: No  . Drug use: No  . Sexual activity: Not on file  Other Topics Concern  . Not on file  Social History Narrative   [employer] education.   Lives in [place] x years   Lost husband [name])  05-2017: CAD, [place]    Father lives  in [place]   2 brothers, they are not local, [name] visits her q 2 weeks   [name] is her best friend        Social Determinants of Radio broadcast assistant Strain: Low Risk   . Difficulty of Paying Living Expenses: Not hard at all  Food Insecurity: No Food Insecurity  . Worried About Charity fundraiser in the Last Year: Never true  . Ran Out of Food in the Last Year: Never true  Transportation Needs: No Transportation Needs  . Lack of Transportation (Medical): No  . Lack of Transportation (Non-Medical): No  Physical Activity:   . Days of Exercise per Week:   . Minutes of Exercise per Session:   Stress:   . Feeling of Stress :     Social Connections:   . Frequency of Communication with Friends and Family:   . Frequency of Social Gatherings with Friends and Family:   . Attends Religious Services:   . Active Member of Clubs or Organizations:   . Attends Archivist Meetings:   Marland Kitchen Marital Status:     Tobacco Counseling Ready to quit: Yes Counseling given: Yes Comment: pt states she has materials.   Clinical Intake: Pain : No/denies pain    Activities of Daily Living In your present state of health, do you have any difficulty performing the following activities: 02/17/2020 01/05/2020  Hearing? N N  Vision? N N  Difficulty concentrating or making decisions? N N  Walking or climbing stairs? N N  Dressing or bathing? N N  Doing errands, shopping? Y Y  Comment - Does not Physiological scientist and eating ? N -  Using the Toilet? N -  In the past six months, have you accidently leaked urine? N -  Do you have problems with loss of bowel control? N -  Managing your Medications? N -  Managing your Finances? N -  Housekeeping or managing your Housekeeping? N -  Some recent data might be hidden    Patient Care Team: Colon Branch, MD as PCP - General (Internal Medicine)  Indicate any recent Medical Services you may have received from other than [facility] providers in the past year (date may be approximate).     Assessment:   This is a routine wellness examination for [name].  Dietary issues and exercise activities discussed: Current Exercise Habits: The patient does not participate in regular exercise at present, Exercise limited by: None identified Diet (meal preparation, eat out, water intake, caffeinated beverages, dairy products, fruits and vegetables): in general, an "unhealthy" diet Education provided.    Goals    . Quit Smoking      Depression Screen PHQ 2/9 Scores 02/17/2020 01/05/2020 10/03/2017 09/27/2016 09/27/2015 09/24/2014 07/23/2013  PHQ - 2 Score 0 0 - 0 0 0 0  PHQ- 9 Score - 0 - - - - -   Exception Documentation - - Other- indicate reason in comment box - - - -  Not completed - - Pt lost husband in November 2018 - - - -    Fall Risk Fall Risk  02/17/2020 01/05/2020 10/03/2017 09/27/2016 09/27/2015  Falls in the past year? 0 0 No No No  Number falls in past yr: 0 0 - - -  Injury with Fall? 0 0 - - -  Follow up Falls prevention discussed;Education provided Falls evaluation completed - - -   Lives alone. Husband passed 2 yrs ago.  Any stairs in or around  the home? No  If so, are there any without handrails? No  Home free of loose throw rugs in walkways, pet beds, electrical cords, etc? Yes  Adequate lighting in your home to reduce risk of falls? Yes   ASSISTIVE DEVICES UTILIZED TO PREVENT FALLS:  Life alert? No  Use of a cane, walker or w/c? No  Grab bars in the bathroom? Yes  Shower chair or bench in shower? Yes  Elevated toilet seat or a handicapped toilet? No    Cognitive Function: Ad8 score reviewed for issues:  Issues making decisions:no  Less interest in hobbies / activities:no  Repeats questions, stories (family complaining):no  Trouble using ordinary gadgets (microwave, computer, phone):no  Forgets the month or year: no  Mismanaging finances: no  Remembering appts:no  Daily problems with thinking and/or memory:no Ad8 score is=0       Immunizations Immunization History  Administered Date(s) Administered  . Influenza Split 04/22/2014, 04/30/2018  . Influenza, High Dose Seasonal PF 04/15/2013, 05/03/2017  . Influenza-Unspecified 04/15/2014, 03/17/2015, 05/16/2016, 04/15/2018  . Pneumococcal Conjugate-13 09/24/2014  . Pneumococcal Polysaccharide-23 07/23/2013  . Tetanus 07/23/2013  . Zoster 09/21/2013    TDAP status: Up to date Pneumococcal vaccine status: Up to date Covid-19 vaccine status: Information provided on how to obtain vaccines.   Qualifies for Shingles Vaccine? Yes   Zostavax completed Yes     Screening Tests Health  Maintenance  Topic Date Due  . COVID-19 Vaccine (1) Never done  . INFLUENZA VACCINE  02/14/2020  . MAMMOGRAM  01/04/2021 (Originally 01/11/2018)  . Fecal DNA (Cologuard)  01/04/2021 (Originally 10/19/2019)  . TETANUS/TDAP  07/24/2023  . DEXA SCAN  Completed  . Hepatitis C Screening  Completed  . PNA vac Low Risk Adult  Completed    Health Maintenance  Health Maintenance Due  Topic Date Due  . COVID-19 Vaccine (1) Never done  . INFLUENZA VACCINE  02/14/2020    Colorectal cancer screening: Completed 01/09/17. Repeat every 3 years Mammogram status: Ordered 06/18/19. Pt provided with contact info and advised to call to schedule appt.  Bone density scan-pt will discuss w/ PCP at next visit.  Lung Cancer Screening: (Low Dose CT Chest recommended if Age 61-80 years, 30 pack-year currently smoking OR have quit w/in 15years.) does qualify.   Lung Cancer Screening Referral: pt declines  Additional Screening:  Hepatitis C Screening: does qualify; Completed 09/27/16  Vision Screening: Recommended annual ophthalmology exams for early detection of glaucoma and other disorders of the eye. Is the patient up to date with their annual eye exam?  No    Dental Screening: Recommended annual dental exams for proper oral hygiene  Community Resource Referral / Chronic Care Management: CRR required this visit?  No   CCM required this visit?  No      Plan:    Please schedule your next medicare wellness visit with me in 1 yr.  Continue to eat heart healthy diet (full of fruits, vegetables, whole grains, lean protein, water--limit salt, fat, and sugar intake) and increase physical activity as tolerated.  Continue doing brain stimulating activities (puzzles, reading, adult coloring books, staying active) to keep memory sharp.   Bring a copy of your living will and/or healthcare power of attorney to your next office visit.   I have personally reviewed and noted the following in the patient's  chart:   . Medical and social history . Use of alcohol, tobacco or illicit drugs  . Current medications and supplements . Functional ability and status .  Nutritional status . Physical activity . Advanced directives . List of other physicians . Hospitalizations, surgeries, and ER visits in previous 12 months . Vitals . Screenings to include cognitive, depression, and falls . Referrals and appointments  In addition, I have reviewed and discussed with patient certain preventive protocols, quality metrics, and best practice recommendations. A written personalized care plan for preventive services as well as general preventive health recommendations were provided to patient.    Due to this being a telephonic visit, the after visit summary with patients personalized plan was offered to patient via mail or my-chart. Patient declined at this time.   Shela Nevin, [place]   02/17/2020

## 2020-02-17 ENCOUNTER — Other Ambulatory Visit: Payer: Self-pay

## 2020-02-17 ENCOUNTER — Encounter: Payer: Self-pay | Admitting: *Deleted

## 2020-02-17 ENCOUNTER — Ambulatory Visit (INDEPENDENT_AMBULATORY_CARE_PROVIDER_SITE_OTHER): Payer: Medicare Other | Admitting: *Deleted

## 2020-02-17 DIAGNOSIS — Z0000 Encounter for general adult medical examination without abnormal findings: Secondary | ICD-10-CM

## 2020-02-17 NOTE — Patient Instructions (Signed)
Please schedule your next medicare wellness visit with me in 1 yr.  Continue to eat heart healthy diet (full of fruits, vegetables, whole grains, lean protein, water--limit salt, fat, and sugar intake) and increase physical activity as tolerated.  Continue doing brain stimulating activities (puzzles, reading, adult coloring books, staying active) to keep memory sharp.   Bring a copy of your living will and/or healthcare power of attorney to your next office visit.   [name] , Thank you for taking time to come for your Medicare Wellness Visit. I appreciate your ongoing commitment to your health goals. Please review the following plan we discussed and let me know if I can assist you in the future.   These are the goals we discussed: Goals     DIET - EAT MORE FRUITS AND VEGETABLES     Quit Smoking       This is a list of the screening recommended for you and due dates:  Health Maintenance  Topic Date Due   COVID-19 Vaccine (1) Never done   Flu Shot  02/14/2020   Mammogram  01/04/2021*   Cologuard (Stool DNA test)  01/04/2021*   Tetanus Vaccine  07/24/2023   DEXA scan (bone density measurement)  Completed    Hepatitis C: One time screening is recommended by Center for Disease Control  (CDC) for  adults born from [birth year] through 1964.   Completed   Pneumonia vaccines  Completed  *Topic was postponed. The date shown is not the original due date.    Preventive Care 55 Years and Older, Female Preventive care refers to lifestyle choices and visits with your health care provider that can promote health and wellness. This includes:  A yearly physical exam. This is also called an annual well check.  Regular dental and eye exams.  Immunizations.  Screening for certain conditions.  Healthy lifestyle choices, such as diet and exercise. What can I expect for my preventive care visit? Physical exam Your health care provider will check:  Height and weight. These may be used to  calculate body mass index (BMI), which is a measurement that tells if you are at a healthy weight.  Heart rate and blood pressure.  Your skin for abnormal spots. Counseling Your health care provider may ask you questions about:  Alcohol, tobacco, and drug use.  Emotional well-being.  Home and relationship well-being.  Sexual activity.  Eating habits.  History of falls.  Memory and ability to understand (cognition).  Work and work Statistician.  Pregnancy and menstrual history. What immunizations do I need?  Influenza (flu) vaccine  This is recommended every year. Tetanus, diphtheria, and pertussis (Tdap) vaccine  You may need a Td booster every 10 years. Varicella (chickenpox) vaccine  You may need this vaccine if you have not already been vaccinated. Zoster (shingles) vaccine  You may need this after age 31. Pneumococcal conjugate (PCV13) vaccine  One dose is recommended after age 35. Pneumococcal polysaccharide (PPSV23) vaccine  One dose is recommended after age 51. Measles, mumps, and rubella (MMR) vaccine  You may need at least one dose of MMR if you were born in 1957 or later. You may also need a second dose. Meningococcal conjugate (MenACWY) vaccine  You may need this if you have certain conditions. Hepatitis A vaccine  You may need this if you have certain conditions or if you travel or work in places where you may be exposed to hepatitis A. Hepatitis B vaccine  You may need this if you  have certain conditions or if you travel or work in places where you may be exposed to hepatitis B. °Haemophilus influenzae type b (Hib) vaccine °· You may need this if you have certain conditions. °You may receive vaccines as individual doses or as more than one vaccine together in one shot (combination vaccines). Talk with your health care provider about the risks and benefits of combination vaccines. °What tests do I need? °Blood tests °· Lipid and cholesterol levels.  These may be checked every 5 years, or more frequently depending on your overall health. °· Hepatitis C test. °· Hepatitis B test. °Screening °· Lung cancer screening. You may have this screening every year starting at age 55 if you have a 30-pack-year history of smoking and currently smoke or have quit within the past 15 years. °· Colorectal cancer screening. All adults should have this screening starting at age 50 and continuing until age 75. Your health care provider may recommend screening at age 45 if you are at increased risk. You will have tests every 1-10 years, depending on your results and the type of screening test. °· Diabetes screening. This is done by checking your blood sugar (glucose) after you have not eaten for a while (fasting). You may have this done every 1-3 years. °· Mammogram. This may be done every 1-2 years. Talk with your health care provider about how often you should have regular mammograms. °· BRCA-related cancer screening. This may be done if you have a family history of breast, ovarian, tubal, or peritoneal cancers. °Other tests °· Sexually transmitted disease (STD) testing. °· Bone density scan. This is done to screen for osteoporosis. You may have this done starting at age 65. °Follow these instructions at home: °Eating and drinking °· Eat a diet that includes fresh fruits and vegetables, whole grains, lean protein, and low-fat dairy products. Limit your intake of foods with high amounts of sugar, saturated fats, and salt. °· Take vitamin and mineral supplements as recommended by your health care provider. °· Do not drink alcohol if your health care provider tells you not to drink. °· If you drink alcohol: °? Limit how much you have to 0-1 drink a day. °? Be aware of how much alcohol is in your drink. In the [place] one drink equals one 12 oz bottle of beer (355 mL), one 5 oz glass of wine (148 mL), or one 1½ oz glass of hard liquor (44 mL). °Lifestyle °· Take daily care of your teeth  and gums. °· Stay active. Exercise for at least 30 minutes on 5 or more days each week. °· Do not use any products that contain nicotine or tobacco, such as cigarettes, e-cigarettes, and chewing tobacco. If you need help quitting, ask your health care provider. °· If you are sexually active, practice safe sex. Use a condom or other form of protection in order to prevent STIs (sexually transmitted infections). °· Talk with your health care provider about taking a low-dose aspirin or statin. °What's next? °· Go to your health care provider once a year for a well check visit. °· Ask your health care provider how often you should have your eyes and teeth checked. °· Stay up to date on all vaccines. °This information is not intended to replace advice given to you by your health care provider. Make sure you discuss any questions you have with your health care provider. °Document Revised: 06/26/2018 Document Reviewed: 06/26/2018 °Elsevier Patient Education © 2020 Elsevier Inc. ° °

## 2020-11-24 ENCOUNTER — Other Ambulatory Visit: Payer: Self-pay | Admitting: Internal Medicine

## 2021-01-06 ENCOUNTER — Encounter: Payer: Self-pay | Admitting: Internal Medicine

## 2021-01-06 ENCOUNTER — Other Ambulatory Visit: Payer: Self-pay

## 2021-01-06 ENCOUNTER — Ambulatory Visit (INDEPENDENT_AMBULATORY_CARE_PROVIDER_SITE_OTHER): Payer: Medicare Other | Admitting: Internal Medicine

## 2021-01-06 VITALS — BP 114/72 | HR 57 | Temp 98.2°F | Resp 16 | Ht 61.0 in | Wt 105.0 lb

## 2021-01-06 DIAGNOSIS — Z0000 Encounter for general adult medical examination without abnormal findings: Secondary | ICD-10-CM | POA: Diagnosis not present

## 2021-01-06 LAB — CBC WITH DIFFERENTIAL/PLATELET
Basophils Absolute: 0.1 10*3/uL (ref 0.0–0.1)
Basophils Relative: 0.7 % (ref 0.0–3.0)
Eosinophils Absolute: 0.1 10*3/uL (ref 0.0–0.7)
Eosinophils Relative: 1.3 % (ref 0.0–5.0)
HCT: 37.6 % (ref 36.0–46.0)
Hemoglobin: 13.1 g/dL (ref 12.0–15.0)
Lymphocytes Relative: 23.9 % (ref 12.0–46.0)
Lymphs Abs: 1.9 10*3/uL (ref 0.7–4.0)
MCHC: 34.8 g/dL (ref 30.0–36.0)
MCV: 95 fl (ref 78.0–100.0)
Monocytes Absolute: 0.4 10*3/uL (ref 0.1–1.0)
Monocytes Relative: 4.5 % (ref 3.0–12.0)
Neutro Abs: 5.6 10*3/uL (ref 1.4–7.7)
Neutrophils Relative %: 69.6 % (ref 43.0–77.0)
Platelets: 153 10*3/uL (ref 150.0–400.0)
RBC: 3.96 Mil/uL (ref 3.87–5.11)
RDW: 12.3 % (ref 11.5–15.5)
WBC: 8 10*3/uL (ref 4.0–10.5)

## 2021-01-06 LAB — COMPREHENSIVE METABOLIC PANEL
ALT: 13 U/L (ref 0–35)
AST: 19 U/L (ref 0–37)
Albumin: 4.5 g/dL (ref 3.5–5.2)
Alkaline Phosphatase: 37 U/L — ABNORMAL LOW (ref 39–117)
BUN: 13 mg/dL (ref 6–23)
CO2: 32 mEq/L (ref 19–32)
Calcium: 10.4 mg/dL (ref 8.4–10.5)
Chloride: 102 mEq/L (ref 96–112)
Creatinine, Ser: 0.83 mg/dL (ref 0.40–1.20)
GFR: 69.85 mL/min (ref >60.00)
Glucose, Bld: 87 mg/dL (ref 70–99)
Potassium: 3.9 mEq/L (ref 3.5–5.1)
Sodium: 142 mEq/L (ref 135–145)
Total Bilirubin: 0.6 mg/dL (ref 0.2–1.2)
Total Protein: 6.9 g/dL (ref 6.0–8.3)

## 2021-01-06 LAB — LIPID PANEL
Cholesterol: 182 mg/dL (ref 0–200)
HDL: 83.1 mg/dL (ref >39.00)
LDL Cholesterol: 86 mg/dL (ref 0–99)
NonHDL: 98.87
Total CHOL/HDL Ratio: 2
Triglycerides: 63 mg/dL (ref 0.0–149.0)
VLDL: 12.6 mg/dL (ref 0.0–40.0)

## 2021-01-06 NOTE — Progress Notes (Signed)
Subjective:    Patient ID: [name], female    DOB: 1945-08-21, 75 y.o.   MRN: [id number]  DOS:  01/06/2021 Type of visit - description: CPX Since the last office visit she is doing well. Reports no concerns, problems or symptoms.  Review of Systems   A 14 point review of systems is negative    Past Medical History:  Diagnosis Date   Anxiety    Anxiety and depression 07/23/2013   Depression    h/o early 1980's- not currently   Osteoporosis     Past Surgical History:  Procedure Laterality Date   surgery     R arm and shoulder   TUBAL LIGATION     Social History   Socioeconomic History   Marital status: Widowed    Spouse name: Not on file   Number of children: 0   Years of education: Not on file   Highest education level: Not on file  Occupational History   Occupation: retired, catering   Tobacco Use   Smoking status: Every Day    Packs/day: 0.75    Pack years: 0.00    Types: Cigarettes   Smokeless tobacco: Never   Tobacco comments:     < 1 ppd   Substance and Sexual Activity   Alcohol use: No   Drug use: No   Sexual activity: Not on file  Other Topics Concern   Not on file  Social History Narrative   High School education.   Lives in [place] x years   Lost husband [name])  [phone]: [place], [place]    Father lives in [place]   2 brothers, they are not local, [name] visits her q 2 weeks   [name] is her best friend        Social Determinants of Radio broadcast assistant Strain: Low Risk    Difficulty of Paying Living Expenses: Not hard at all  Food Insecurity: No Food Insecurity   Worried About Charity fundraiser in the Last Year: Never true   Arboriculturist in the Last Year: Never true  Transportation Needs: No Transportation Needs   Lack of Transportation (Medical): No   Lack of Transportation (Non-Medical): No  Physical Activity: Not on file  Stress: Not on file  Social Connections: Not on file  Intimate Partner Violence: Not on file    Allergies  as of 01/06/2021   No Known Allergies      Medication List        Accurate as of January 06, 2021 11:59 PM. If you have any questions, ask your nurse or doctor.          cholecalciferol 1000 units tablet Commonly known as: VITAMIN D Take 1,000 Units by mouth daily.   escitalopram 20 MG tablet Commonly known as: LEXAPRO Take 1 tablet (20 mg total) by mouth daily.   multivitamin with minerals Tabs tablet Take 1 tablet by mouth daily.           Objective:   Physical Exam BP 114/72 (BP Location: Left Arm, Patient Position: Sitting, Cuff Size: Small)   Pulse (!) 57   Temp 98.2 F (36.8 C) (Oral)   Resp 16   Ht 5\' 1"  (1.549 m)   Wt 105 lb (47.6 kg)   SpO2 97%   BMI 19.84 kg/m  General: Well developed, NAD, BMI noted Neck: No  thyromegaly  HEENT:  Normocephalic . Face symmetric, atraumatic Lungs:  CTA B Normal respiratory effort,  no intercostal retractions, no accessory muscle use. Heart: RRR,  no murmur.  Abdomen:  Not distended, soft, non-tender. No rebound or rigidity.  Palpable nontender aorta, no bruit. Lower extremities: no pretibial edema bilaterally  Skin: Exposed areas without rash. Not pale. Not jaundice Neurologic:  alert & oriented X3.  Speech normal, gait appropriate for age and unassisted Strength symmetric and appropriate for age.  Psych: Cognition and judgment appear intact.  Cooperative with normal attention span and concentration.  Behavior appropriate. No anxious or depressed appearing.     Assessment    ASSESSMENT Anxiety (fear of ridding a car), depression Osteoporosis t score -2.9 (09-2013). Started Fosamax 2015   Abnormal Lung Ca screening CT 09-2016, recheck CT 12-2016: findings resolved ; pulmonary rec annual CTs, next 12-2017 Does not drive   PLAN Here for CPX Anxiety, depression: On Lexapro, well controlled. Osteoporosis: Fosamax x5 years, on a holiday, declined recheck a DEXA Has declined a number of screenings and  interventions, I explained the benefits of each one of them. RTC 1 year   This visit occurred during the SARS-CoV-2 public health emergency.  Safety protocols were in place, including screening questions prior to the visit, additional usage of staff PPE, and extensive cleaning of exam room while observing appropriate contact time as indicated for disinfecting solutions.

## 2021-01-06 NOTE — Patient Instructions (Addendum)
   GO TO THE LAB : Get the blood work     GO TO THE FRONT DESK, PLEASE SCHEDULE YOUR APPOINTMENTS Come back for a physical exam in 1 year 

## 2021-01-07 ENCOUNTER — Encounter: Payer: Self-pay | Admitting: Internal Medicine

## 2021-01-07 NOTE — Assessment & Plan Note (Signed)
-  Td 07-2013 -PNM shot 07-2013; prevnar--2016 - zostavax: 09-2013 - shingrix d/w pt, not interested., - covid vaccine none,   d/w pt  pro>>cons, not interested ---No further PAPs, see comments from 2017  --MMG-: 01-2181 per [facility] , declined a MMG. --Had a Cscope for bleeding 1999 d/t rectal bleed, cologuard neg 2015, + 10-2016: s/p cscope 12/2016, due for colonoscopy, strongly declined --H/o palpable abdominal aorta, neg  Ultrasound 2015 -CT for lung ca screening: Declined  - Labs:   CMP, FLP, CBC

## 2021-01-07 NOTE — Assessment & Plan Note (Signed)
Here for CPX Anxiety, depression: On Lexapro, well controlled. Osteoporosis: Fosamax x5 years, on a holiday, declined recheck a DEXA Has declined a number of screenings and interventions, I explained the benefits of each one of them. RTC 1 year

## 2021-02-13 ENCOUNTER — Telehealth: Payer: Self-pay | Admitting: Internal Medicine

## 2021-02-13 NOTE — Telephone Encounter (Signed)
Copied from [place] [phone]. Topic: Medicare AWV >> Feb 13, 2021  9:44 AM Harris-Coley, Hannah Beat wrote: Reason for CRM: Left message for patient to schedule Annual Wellness Visit.  Please schedule with Health Nurse Advisor Augustine Radar. at [facility].

## 2021-04-19 ENCOUNTER — Other Ambulatory Visit: Payer: Self-pay | Admitting: Internal Medicine

## 2021-06-06 ENCOUNTER — Telehealth: Payer: Self-pay | Admitting: Internal Medicine

## 2021-06-06 NOTE — Telephone Encounter (Signed)
Left message for patient to call back and schedule Medicare Annual Wellness Visit (AWV) in office.   If not able to come in office, please offer to do virtually or by telephone.  Left office number and my jabber [phone].  Last AWV:02/17/2020  Please schedule at anytime with Nurse Health Advisor.

## 2021-06-13 ENCOUNTER — Ambulatory Visit (INDEPENDENT_AMBULATORY_CARE_PROVIDER_SITE_OTHER): Payer: Medicare Other

## 2021-06-13 ENCOUNTER — Other Ambulatory Visit: Payer: Self-pay

## 2021-06-13 DIAGNOSIS — Z0000 Encounter for general adult medical examination without abnormal findings: Secondary | ICD-10-CM

## 2021-06-13 NOTE — Patient Instructions (Signed)
[name] , Thank you for taking time to come for your Medicare Wellness Visit. I appreciate your ongoing commitment to your health goals. Please review the following plan we discussed and let me know if I can assist you in the future.   Screening recommendations/referrals: Colonoscopy: Done 01/09/17 repeat every 3 years  Mammogram: Done 01/11/17 repeat every year  Bone Density: Done 10/02/16 repeat every 2 years  Recommended yearly ophthalmology/optometry visit for glaucoma screening and checkup Recommended yearly dental visit for hygiene and checkup  Vaccinations: Influenza vaccine: Done 04/30/21 Pneumococcal vaccine: Up to date Tdap vaccine: Done 07/23/13 repeat every 10 years  Shingles vaccine: Shingrix discussed. Please contact your pharmacy for coverage information.    Covid-19:Declined and discussed   Advanced directives: Please bring a copy of your health care power of attorney and living will to the office at your convenience.  Conditions/risks identified: None at this time  Next appointment: Follow up in one year for your annual wellness visit    Preventive Care 65 Years and Older, Female Preventive care refers to lifestyle choices and visits with your health care provider that can promote health and wellness. What does preventive care include? A yearly physical exam. This is also called an annual well check. Dental exams once or twice a year. Routine eye exams. Ask your health care provider how often you should have your eyes checked. Personal lifestyle choices, including: Daily care of your teeth and gums. Regular physical activity. Eating a healthy diet. Avoiding tobacco and drug use. Limiting alcohol use. Practicing safe sex. Taking low-dose aspirin every day. Taking vitamin and mineral supplements as recommended by your health care provider. What happens during an annual well check? The services and screenings done by your health care provider during your annual well  check will depend on your age, overall health, lifestyle risk factors, and family history of disease. Counseling  Your health care provider may ask you questions about your: Alcohol use. Tobacco use. Drug use. Emotional well-being. Home and relationship well-being. Sexual activity. Eating habits. History of falls. Memory and ability to understand (cognition). Work and work Statistician. Reproductive health. Screening  You may have the following tests or measurements: Height, weight, and BMI. Blood pressure. Lipid and cholesterol levels. These may be checked every 5 years, or more frequently if you are over 10 years old. Skin check. Lung cancer screening. You may have this screening every year starting at age 19 if you have a 30-pack-year history of smoking and currently smoke or have quit within the past 15 years. Fecal occult blood test (FOBT) of the stool. You may have this test every year starting at age 67. Flexible sigmoidoscopy or colonoscopy. You may have a sigmoidoscopy every 5 years or a colonoscopy every 10 years starting at age 74. Hepatitis C blood test. Hepatitis B blood test. Sexually transmitted disease (STD) testing. Diabetes screening. This is done by checking your blood sugar (glucose) after you have not eaten for a while (fasting). You may have this done every 1-3 years. Bone density scan. This is done to screen for osteoporosis. You may have this done starting at age 74. Mammogram. This may be done every 1-2 years. Talk to your health care provider about how often you should have regular mammograms. Talk with your health care provider about your test results, treatment options, and if necessary, the need for more tests. Vaccines  Your health care provider may recommend certain vaccines, such as: Influenza vaccine. This is recommended every year. Tetanus,  diphtheria, and acellular pertussis (Tdap, Td) vaccine. You may need a Td booster every 10 years. Zoster  vaccine. You may need this after age 62. Pneumococcal 13-valent conjugate (PCV13) vaccine. One dose is recommended after age 79. Pneumococcal polysaccharide (PPSV23) vaccine. One dose is recommended after age 65. Talk to your health care provider about which screenings and vaccines you need and how often you need them. This information is not intended to replace advice given to you by your health care provider. Make sure you discuss any questions you have with your health care provider. Document Released: 07/29/2015 Document Revised: 03/21/2016 Document Reviewed: 05/03/2015 Elsevier Interactive Patient Education  2017 [place] Prevention in the Home Falls can cause injuries. They can happen to people of all ages. There are many things you can do to make your home safe and to help prevent falls. What can I do on the outside of my home? Regularly fix the edges of walkways and driveways and fix any cracks. Remove anything that might make you trip as you walk through a door, such as a raised step or threshold. Trim any bushes or trees on the path to your home. Use bright outdoor lighting. Clear any walking paths of anything that might make someone trip, such as rocks or tools. Regularly check to see if handrails are loose or broken. Make sure that both sides of any steps have handrails. Any raised decks and porches should have guardrails on the edges. Have any leaves, snow, or ice cleared regularly. Use sand or salt on walking paths during winter. Clean up any spills in your garage right away. This includes oil or grease spills. What can I do in the bathroom? Use night lights. Install grab bars by the toilet and in the tub and shower. Do not use towel bars as grab bars. Use non-skid mats or decals in the tub or shower. If you need to sit down in the shower, use a plastic, non-slip stool. Keep the floor dry. Clean up any water that spills on the floor as soon as it happens. Remove  soap buildup in the tub or shower regularly. Attach bath mats securely with double-sided non-slip rug tape. Do not have throw rugs and other things on the floor that can make you trip. What can I do in the bedroom? Use night lights. Make sure that you have a light by your bed that is easy to reach. Do not use any sheets or blankets that are too big for your bed. They should not hang down onto the floor. Have a firm chair that has side arms. You can use this for support while you get dressed. Do not have throw rugs and other things on the floor that can make you trip. What can I do in the kitchen? Clean up any spills right away. Avoid walking on wet floors. Keep items that you use a lot in easy-to-reach places. If you need to reach something above you, use a strong step stool that has a grab bar. Keep electrical cords out of the way. Do not use floor polish or wax that makes floors slippery. If you must use wax, use non-skid floor wax. Do not have throw rugs and other things on the floor that can make you trip. What can I do with my stairs? Do not leave any items on the stairs. Make sure that there are handrails on both sides of the stairs and use them. Fix handrails that are broken or loose. Make  sure that handrails are as long as the stairways. Check any carpeting to make sure that it is firmly attached to the stairs. Fix any carpet that is loose or worn. Avoid having throw rugs at the top or bottom of the stairs. If you do have throw rugs, attach them to the floor with carpet tape. Make sure that you have a light switch at the top of the stairs and the bottom of the stairs. If you do not have them, ask someone to add them for you. What else can I do to help prevent falls? Wear shoes that: Do not have high heels. Have rubber bottoms. Are comfortable and fit you well. Are closed at the toe. Do not wear sandals. If you use a stepladder: Make sure that it is fully opened. Do not climb a  closed stepladder. Make sure that both sides of the stepladder are locked into place. Ask someone to hold it for you, if possible. Clearly mark and make sure that you can see: Any grab bars or handrails. First and last steps. Where the edge of each step is. Use tools that help you move around (mobility aids) if they are needed. These include: Canes. Walkers. Scooters. Crutches. Turn on the lights when you go into a dark area. Replace any light bulbs as soon as they burn out. Set up your furniture so you have a clear path. Avoid moving your furniture around. If any of your floors are uneven, fix them. If there are any pets around you, be aware of where they are. Review your medicines with your doctor. Some medicines can make you feel dizzy. This can increase your chance of falling. Ask your doctor what other things that you can do to help prevent falls. This information is not intended to replace advice given to you by your health care provider. Make sure you discuss any questions you have with your health care provider. Document Released: 04/28/2009 Document Revised: 12/08/2015 Document Reviewed: 08/06/2014 Elsevier Interactive Patient Education  2017 [employer].

## 2021-06-13 NOTE — Progress Notes (Addendum)
Virtual Visit via Telephone Note  I connected with  [name] on 06/13/21 at  1:45 PM EST by telephone and verified that I am speaking with the correct person using two identifiers.  Medicare Annual Wellness visit completed telephonically due to Covid-19 pandemic.   Persons participating in this call: This Health Coach and this patient.   Location: Patient: home Provider: office   I discussed the limitations, risks, security and privacy concerns of performing an evaluation and management service by telephone and the availability of in person appointments. The patient expressed understanding and agreed to proceed.  Unable to perform video visit due to video visit attempted and failed and/or patient does not have video capability.   Some vital signs may be absent or patient reported.   Pamela Brace, LPN   Subjective:   [name] is a 75 y.o. female who presents for Medicare Annual (Subsequent) preventive examination.  Review of Systems     Cardiac Risk Factors include: advanced age (>29men, >95 women)     Objective:    There were no vitals filed for this visit. There is no height or weight on file to calculate BMI.  Advanced Directives 06/13/2021 02/17/2020 12/26/2016  Does Patient Have a Medical Advance Directive? Yes Yes No  Type of Paramedic of [place];Living will -  Does patient want to make changes to medical advance directive? - No - Patient declined -  Copy of [name] in Chart? No - copy requested No - copy requested -    Current Medications (verified) Outpatient Encounter Medications as of 06/13/2021  Medication Sig   cholecalciferol (VITAMIN D) 1000 UNITS tablet Take 1,000 Units by mouth daily.    escitalopram (LEXAPRO) 20 MG tablet TAKE 1 TABLET BY MOUTH  DAILY   Multiple Vitamin (MULTIVITAMIN WITH MINERALS) TABS tablet Take 1 tablet by mouth daily.   No facility-administered  encounter medications on file as of 06/13/2021.    Allergies (verified) Patient has no known allergies.   History: Past Medical History:  Diagnosis Date   Anxiety    Anxiety and depression 07/23/2013   Depression    h/o early 23's- not currently   Osteoporosis    Past Surgical History:  Procedure Laterality Date   surgery     R arm and shoulder   TUBAL LIGATION     Family History  Problem Relation Age of Onset   Breast cancer Mother    Dementia Mother    CAD Other        MI GF age 88   Diabetes Father    Ovarian cancer Maternal Grandmother    Colon cancer Neg Hx    Colon polyps Neg Hx    Esophageal cancer Neg Hx    Rectal cancer Neg Hx    Stomach cancer Neg Hx    Social History   Socioeconomic History   Marital status: Widowed    Spouse name: Not on file   Number of children: 0   Years of education: Not on file   Highest education level: Not on file  Occupational History   Occupation: retired, catering   Tobacco Use   Smoking status: Every Day    Packs/day: 0.75    Types: Cigarettes   Smokeless tobacco: Never   Tobacco comments:     < 1 ppd   Substance and Sexual Activity   Alcohol use: No   Drug use: No   Sexual  activity: Not on file  Other Topics Concern   Not on file  Social History Narrative   High School education.   L[place] in [name] years   Lost husband [name])  [phone]: CAD, [place]    Father lives in [place]   2 brothers, they are not local, [name] visits her q 2 weeks   [name] is her best friend        Social Determinants of Radio broadcast assistant Strain: Low Risk    Difficulty of Paying Living Expenses: Not hard at all  Food Insecurity: No Food Insecurity   Worried About Charity fundraiser in the Last Year: Never true   Arboriculturist in the Last Year: Never true  Transportation Needs: No Transportation Needs   Lack of Transportation (Medical): No   Lack of Transportation (Non-Medical): No  Physical Activity: Inactive   Days of  Exercise per Week: 0 days   Minutes of Exercise per Session: 0 min  Stress: No Stress Concern Present   Feeling of Stress : Not at all  Social Connections: Moderately Isolated   Frequency of Communication with Friends and Family: More than three times a week   Frequency of Social Gatherings with Friends and Family: Once a week   Attends Religious Services: 1 to 4 times per year   Active Member of [employer] or Organizations: No   Attends Archivist Meetings: Never   Marital Status: Widowed    Tobacco Counseling Ready to quit: Not Answered Counseling given: Not Answered Tobacco comments:  < 1 ppd    Clinical Intake:  Pre-visit preparation completed: Yes  Pain : No/denies pain     BMI - recorded: 19.85 Nutritional Status: BMI of 19-24  Normal Nutritional Risks: None Diabetes: No  How often do you need to have someone help you when you read instructions, pamphlets, or other written materials from your doctor or pharmacy?: 1 - Never  Diabetic?no  Interpreter Needed?: No  Information entered by :: Pamela Rakes, LPN   Activities of Daily Living In your present state of health, do you have any difficulty performing the following activities: 06/13/2021  Hearing? N  Vision? N  Difficulty concentrating or making decisions? N  Walking or climbing stairs? N  Dressing or bathing? N  Doing errands, shopping? N  Preparing Food and eating ? N  Using the Toilet? N  In the past six months, have you accidently leaked urine? N  Do you have problems with loss of bowel control? N  Managing your Medications? N  Managing your Finances? N  Housekeeping or managing your Housekeeping? N  Some recent data might be hidden    Patient Care Team: Colon Branch, MD as PCP - General (Internal Medicine)  Indicate any recent Medical Services you may have received from other than [facility] providers in the past year (date may be approximate).     Assessment:   This is a routine wellness  examination for [name].  Hearing/Vision screen Hearing Screening - Comments:: Pt denies any hearing issues Vision Screening - Comments:: Pt [facility] opthalmology for annual eye exams   Dietary issues and exercise activities discussed: Current Exercise Habits: The patient does not participate in regular exercise at present   Goals Addressed             This Visit's Progress    Patient Stated       Stay healthy  Depression Screen PHQ 2/9 Scores 06/13/2021 01/06/2021 02/17/2020 01/05/2020 10/03/2017 09/27/2016 09/27/2015  PHQ - 2 Score 0 0 0 0 - 0 0  PHQ- 9 Score - 0 - 0 - - -  Exception Documentation - - - - Other- indicate reason in comment box - -  Not completed - - - - Pt lost husband in November 2018 - -    Fall Risk Fall Risk  06/13/2021 01/06/2021 02/17/2020 01/05/2020 10/03/2017  Falls in the past year? 0 0 0 0 No  Number falls in past yr: 0 0 0 0 -  Injury with Fall? 0 0 0 0 -  Risk for fall due to : Impaired vision - - - -  Follow up Falls prevention discussed - Falls prevention discussed;Education provided Falls evaluation completed -    FALL RISK PREVENTION PERTAINING TO THE HOME:  Any stairs in or around the home? No  If so, are there any without handrails? No  Home free of loose throw rugs in walkways, pet beds, electrical cords, etc? Yes  Adequate lighting in your home to reduce risk of falls? Yes   ASSISTIVE DEVICES UTILIZED TO PREVENT FALLS:  Life alert? No  Use of a cane, walker or w/c? No  Grab bars in the bathroom? No  Shower chair or bench in shower? No  Elevated toilet seat or a handicapped toilet? No   TIMED UP AND GO:  Was the test performed? No .   Cognitive Function:     6CIT Screen 06/13/2021  What Year? 0 points  What month? 0 points  What time? 0 points  Count back from 20 0 points  Months in reverse 0 points  Repeat phrase 6 points  Total Score 6    Immunizations Immunization History  Administered Date(s)  Administered   Influenza Split 04/22/2014, 04/30/2018   Influenza, High Dose Seasonal PF 04/15/2013, 05/03/2017   Influenza-Unspecified 04/15/2014, 05/16/2016, 04/15/2018, 04/30/2021   Pneumococcal Conjugate-13 09/24/2014   Pneumococcal Polysaccharide-23 07/23/2013   Tetanus 07/23/2013   Zoster, Live 09/21/2013    TDAP status: Up to date  Flu: 04/30/21  Pneumococcal vaccine status: Up to date  Covid-19 vaccine status: Declined, Education has been provided regarding the importance of this vaccine but patient still declined. Advised may receive this vaccine at local pharmacy or Health Dept.or vaccine clinic. Aware to provide a copy of the vaccination record if obtained from local pharmacy or Health Dept. Verbalized acceptance and understanding.  Qualifies for Shingles Vaccine? Yes   Zostavax completed No   Shingrix Completed?: No.    Education has been provided regarding the importance of this vaccine. Patient has been advised to call insurance company to determine out of pocket expense if they have not yet received this vaccine. Advised may also receive vaccine at local pharmacy or Health Dept. Verbalized acceptance and understanding.  Screening Tests Health Maintenance  Topic Date Due   Zoster Vaccines- Shingrix (1 of 2) Never done   COVID-19 Vaccine (1) 10/24/2021 (Originally 11/29/1946)   MAMMOGRAM  01/07/2023 (Originally 01/11/2018)   COLONOSCOPY (Pts 45-47yrs Insurance coverage will need to be confirmed)  01/07/2023 (Originally 01/10/2020)   TETANUS/TDAP  07/24/2023   Pneumonia Vaccine 77+ Years old  Completed   INFLUENZA VACCINE  Completed   DEXA SCAN  Completed   Hepatitis C Screening  Completed   HPV VACCINES  Aged Out   Fecal DNA (Cologuard)  Discontinued    Health Maintenance  Health Maintenance Due  Topic Date Due   Zoster  Vaccines- Shingrix (1 of 2) Never done    Colorectal cancer screening: Type of screening: Colonoscopy. Completed 01/09/17. Repeat every 3  years  Mammogram status: Completed 01/11/17. Repeat every year  Bone Density status: Completed 01/09/17. Results reflect: Bone density results: OSTEOPOROSIS. Repeat every 2 years.   Additional Screening:  Hepatitis C Screening:  Completed 09/27/16  Vision Screening: Recommended annual ophthalmology exams for early detection of glaucoma and other disorders of the eye. Is the patient up to date with their annual eye exam?  Yes  Who is the provider or what is the name of the office in which the patient attends annual eye exams? Dr Ellie Lunch If pt is not established with a provider, would they like to be referred to a provider to establish care? No .   Dental Screening: Recommended annual dental exams for proper oral hygiene  Community Resource Referral / Chronic Care Management: CRR required this visit?  No   CCM required this visit?  No      Plan:     I have personally reviewed and noted the following in the patient's chart:   Medical and social history Use of alcohol, tobacco or illicit drugs  Current medications and supplements including opioid prescriptions.  Functional ability and status Nutritional status Physical activity Advanced directives List of other physicians Hospitalizations, surgeries, and ER visits in previous 12 months Vitals Screenings to include cognitive, depression, and falls Referrals and appointments  In addition, I have reviewed and discussed with patient certain preventive protocols, quality metrics, and best practice recommendations. A written personalized care plan for preventive services as well as general preventive health recommendations were provided to patient.     Pamela Brace, LPN   [id number]   Nurse Notes: None  I have reviewed and agree with Health Coaches documentation.  Kathlene November, MD

## 2022-01-08 ENCOUNTER — Ambulatory Visit (INDEPENDENT_AMBULATORY_CARE_PROVIDER_SITE_OTHER): Payer: Medicare Other | Admitting: Internal Medicine

## 2022-01-08 ENCOUNTER — Encounter: Payer: Self-pay | Admitting: Internal Medicine

## 2022-01-08 VITALS — BP 120/82 | HR 69 | Temp 98.2°F | Resp 16 | Ht 61.0 in | Wt 115.2 lb

## 2022-01-08 DIAGNOSIS — Z0000 Encounter for general adult medical examination without abnormal findings: Secondary | ICD-10-CM | POA: Diagnosis not present

## 2022-01-08 DIAGNOSIS — E785 Hyperlipidemia, unspecified: Secondary | ICD-10-CM | POA: Diagnosis not present

## 2022-01-08 LAB — CBC WITH DIFFERENTIAL/PLATELET
Basophils Absolute: 0.1 10*3/uL (ref 0.0–0.1)
Basophils Relative: 0.8 % (ref 0.0–3.0)
Eosinophils Absolute: 0.1 10*3/uL (ref 0.0–0.7)
Eosinophils Relative: 1.4 % (ref 0.0–5.0)
HCT: 38.9 % (ref 36.0–46.0)
Hemoglobin: 13.4 g/dL (ref 12.0–15.0)
Lymphocytes Relative: 18.3 % (ref 12.0–46.0)
Lymphs Abs: 1.5 10*3/uL (ref 0.7–4.0)
MCHC: 34.4 g/dL (ref 30.0–36.0)
MCV: 96.5 fl (ref 78.0–100.0)
Monocytes Absolute: 0.4 10*3/uL (ref 0.1–1.0)
Monocytes Relative: 4.6 % (ref 3.0–12.0)
Neutro Abs: 6.2 10*3/uL (ref 1.4–7.7)
Neutrophils Relative %: 74.9 % (ref 43.0–77.0)
Platelets: 156 10*3/uL (ref 150.0–400.0)
RBC: 4.03 Mil/uL (ref 3.87–5.11)
RDW: 12.6 % (ref 11.5–15.5)
WBC: 8.3 10*3/uL (ref 4.0–10.5)

## 2022-01-08 LAB — BASIC METABOLIC PANEL
BUN: 14 mg/dL (ref 6–23)
CO2: 31 mEq/L (ref 19–32)
Calcium: 10.3 mg/dL (ref 8.4–10.5)
Chloride: 101 mEq/L (ref 96–112)
Creatinine, Ser: 0.87 mg/dL (ref 0.40–1.20)
GFR: 65.55 mL/min (ref >60.00)
Glucose, Bld: 92 mg/dL (ref 70–99)
Potassium: 4.1 mEq/L (ref 3.5–5.1)
Sodium: 141 mEq/L (ref 135–145)

## 2022-01-08 LAB — LIPID PANEL
Cholesterol: 179 mg/dL (ref 0–200)
HDL: 83.1 mg/dL (ref >39.00)
LDL Cholesterol: 82 mg/dL (ref 0–99)
NonHDL: 95.8
Total CHOL/HDL Ratio: 2
Triglycerides: 71 mg/dL (ref 0.0–149.0)
VLDL: 14.2 mg/dL (ref 0.0–40.0)

## 2022-01-08 LAB — TSH: TSH: 0.72 u[IU]/mL (ref 0.35–5.50)

## 2022-01-08 NOTE — Progress Notes (Signed)
Subjective:    Patient ID: [name], female    DOB: 03-07-1946, 76 y.o.   MRN: [id number]  DOS:  01/08/2022 Type of visit - description: CPX  Since the last office visit is doing okay. She is a widow, from time to time has sad moments when she remembers her husband. No suicidal ideas  Review of Systems  Other than above, a 14 point review of systems is negative    Past Medical History:  Diagnosis Date   Anxiety    Anxiety and depression 07/23/2013   Depression    h/o early 1980's- not currently   Osteoporosis     Past Surgical History:  Procedure Laterality Date   surgery     R arm and shoulder   TUBAL LIGATION     Social History   Socioeconomic History   Marital status: Widowed    Spouse name: Not on file   Number of children: 0   Years of education: Not on file   Highest education level: Not on file  Occupational History   Occupation: retired, catering   Tobacco Use   Smoking status: Every Day    Packs/day: 0.75    Types: Cigarettes   Smokeless tobacco: Never   Tobacco comments:     < 1 ppd   Substance and Sexual Activity   Alcohol use: No   Drug use: No   Sexual activity: Not on file     Current Outpatient Medications  Medication Instructions   cholecalciferol (VITAMIN D) 1,000 Units, Oral, Daily   escitalopram (LEXAPRO) 20 MG tablet TAKE 1 TABLET BY MOUTH  DAILY   Multiple Vitamin (MULTIVITAMIN WITH MINERALS) TABS tablet 1 tablet, Oral, Daily       Objective:   Physical Exam BP 120/82   Pulse 69   Temp 98.2 F (36.8 C) (Oral)   Resp 16   Ht 5\' 1"  (1.549 m)   Wt 115 lb 4 oz (52.3 kg)   SpO2 96%   BMI 21.78 kg/m  General: Well developed, NAD, BMI noted Neck: No  thyromegaly  HEENT:  Normocephalic . Face symmetric, atraumatic Lungs:  CTA B Normal respiratory effort, no intercostal retractions, no accessory muscle use. Heart: RRR,  no murmur.  Abdomen:  Not distended, soft, non-tender. No rebound or rigidity.   Lower  extremities: no pretibial edema bilaterally  Skin: Exposed areas without rash. Not pale. Not jaundice Neurologic:  alert & oriented X3.  Speech normal, gait appropriate for age and unassisted Strength symmetric and appropriate for age.  Psych: Cognition and judgment appear intact.  Cooperative with normal attention span and concentration.  Behavior appropriate. No anxious or depressed appearing.     Assessment     ASSESSMENT Dyslipidemia: See OV 12/2021. Anxiety (fear of ridding a car), depression Osteoporosis t score -2.9 (09-2013). Started Fosamax 2015   Abnormal Lung Ca screening CT 09-2016, recheck CT 12-2016: findings resolved ; pulmonary rec annual CTs, next 12-2017 Does not drive   PLAN Here for CPX Dyslipidemia: Previous LDL are satisfactory however her CV RF at 11 years is 18% approximately.  Benefit of statins discussed, she would be willing to try but admits that it will be difficult to come back for follow-up blood work. Anxiety, depression: She has a fear of riding cars, all driving is done by family members. Depression is relatively well controlled on Lexapro, from time to time she feels sad about the loss of her husband few years ago.  Listening therapy provided.  Osteoporosis: On Fosamax holiday, declined DEXA. RTC 1 year

## 2022-01-09 ENCOUNTER — Encounter: Payer: Self-pay | Admitting: Internal Medicine

## 2022-01-10 ENCOUNTER — Telehealth: Payer: Self-pay

## 2022-01-10 NOTE — Telephone Encounter (Signed)
Pt would like a phone call once labs are reviewed

## 2022-01-10 NOTE — Telephone Encounter (Signed)
Advise patient: Labs are okay but per our conversation, she would benefit from cholesterol medication.  Recommend atorvastatin 20 mg 1 p.o. nightly #30 and 3 refills. AST, ALT, FLP in 6 weeks, please arrange.

## 2022-01-10 NOTE — Telephone Encounter (Signed)
Please forward back once labs are reviewed.

## 2022-01-11 MED ORDER — ATORVASTATIN CALCIUM 10 MG PO TABS: 10.0000 mg | ORAL_TABLET | Freq: Every day | ORAL | 3 refills | Status: DC

## 2022-01-11 NOTE — Telephone Encounter (Signed)
Spoke w/ [name]- informed of results and recommendations. She has agreed to start the medication however she does not drive and lives alone- she would not be able to come back for the labs as "its all I can do to get there once yearly." Informed [name] I'd let Dr. Larose Kells know.

## 2022-01-11 NOTE — Telephone Encounter (Signed)
Rx sent 

## 2022-01-11 NOTE — Telephone Encounter (Signed)
Okay, send Lipitor 10 mg for a year. It is a low-dose, unlikely to cause side effects or LFTs abnormality.

## 2022-03-01 ENCOUNTER — Other Ambulatory Visit: Payer: Self-pay | Admitting: Internal Medicine

## 2022-06-11 ENCOUNTER — Telehealth: Payer: Self-pay | Admitting: Internal Medicine

## 2022-06-11 NOTE — Telephone Encounter (Signed)
Left message for patient to call back and schedule Medicare Annual Wellness Visit (AWV) either virtually or phone. Left  my Herbie Drape number [phone]   Last AWV  06/13/21 please schedule with Nurse Health Adviser 2   45 min for awv-i and in office appointments 30 min for awv-s  phone/virtual appointments

## 2022-06-21 ENCOUNTER — Telehealth (INDEPENDENT_AMBULATORY_CARE_PROVIDER_SITE_OTHER): Payer: Medicare Other | Admitting: Family Medicine

## 2022-06-21 DIAGNOSIS — Z0000 Encounter for general adult medical examination without abnormal findings: Secondary | ICD-10-CM | POA: Diagnosis not present

## 2022-06-21 NOTE — Patient Instructions (Addendum)
I really enjoyed getting to talk with you today! I am available on Tuesdays and Thursdays for virtual visits if you have any questions or concerns, or if I can be of any further assistance.   CHECKLIST FROM ANNUAL WELLNESS VISIT:  -Follow up (please call to schedule if not scheduled after visit):  -Inperson visit with your Primary Doctor office: in 3-4 months or as scheduled -yearly for annual wellness visit with primary care office  Here is a list of your preventive care/health maintenance measures and the plan for each if any are due:  Health Maintenance  Topic Date Due   DTaP/Tdap/Td (1 - Tdap) 07/24/2013, can get at pharmacy or doctor office   Zoster Vaccines- Shingrix (1 of 2) 01/09/2023 (Originally 05/31/1997), please check your records and if you already had this provide updated record. If not, it is something you can get at the pharmacy or with your doctor if you wish.    Medicare Annual Wellness (AWV)  06/22/2023   Pneumonia Vaccine 21+ Years old  Completed   INFLUENZA VACCINE  Completed   DEXA SCAN  Due for this now, call office if you decide to due.    Hepatitis C Screening  Completed   HPV VACCINES  Aged Out   MAMMOGRAM  if you wish to do this please let your doctor know.    COLONOSCOPY (Pts 45-47yr Insurance coverage will need to be confirmed)  Discontinued   COVID-19 Vaccine  Discontinued   Fecal DNA (Cologuard)  Discontinued  Due for Lung Cancer screening as we discussed. Please call your doctor if you decide to do this.   -See a dentist at least yearly  -Get your eyes checked and then per your eye specialist's recommendations  -Other issues addressed today:  -smoking cessation - 1-800-QUIT-Now I'm so glad you are interested in quitting smoking! Please call the quit line, set a quit date and call [place] a follow up 1 week before your quit date if you would like further assistance.   -I have included below further information regarding a healthy whole foods based  diet, physical activity guidelines for adults, stress management and opportunities for social connections. I hope you find this information useful.     FOOD - THE FUEL FOR A HAPPY HEALTHY LIFE: -eat real food: lots of colorful vegetables (half the plate) and fruits -5-7 servings of vegetables and fruits per day (fresh or steamed is best), exp. 2 servings of vegetables with lunch and dinner and 2 servings of fruit per day. Berries and greens such as kale and collards are great choices.  -consume on a regular basis: whole grains (make sure first ingredient on label contains the word "whole"), fresh fruits, fish, nuts, seeds, healthy oils (such as olive oil, avocado oil, grape seed oil) -may eat small amounts of dairy and lean meat on occasion, but avoid processed meats such as ham, bacon, lunch meat, etc. -drink water -try to avoid fast food and pre-packaged foods, processed meat -most experts advise limiting sodium to < '2300mg'$  per day, should limit further is any chronic conditions such as high blood pressure, heart disease, diabetes, etc. The American Heart Association advised that < '1500mg'$  is is ideal -try to avoid foods that contain any ingredients with names you do not recognize  -try to avoid sugar/sweets (except for the natural sugar that occurs in fresh fruit) -try to avoid sweet drinks -try to avoid white rice, white bread, pasta (unless whole grain), white or yellow potatoes  MOVE -  the key to keeping your body moving and working best: -if you wish to increase your physical activity, do so gradually and with the approval of your doctor -STOP and seek medical care immediately if you have any chest pain, chest discomfort or trouble breathing  when starting or increasing exercise  -move and stretch your body, legs, feet and arms when sitting for long periods -Physical activity guidelines for optimal health in adults: -least 150 minutes per week of aerobic exercise (can talk, but not sing) once approved by your doctor, 20-30 minutes of sustained activity or two 10 minute episodes of sustained activity every day.  -resistance training at least 2 days per week if approved by your doctor -balance exercises 3+ days per week:   Stand somewhere where you have something sturdy to hold onto if you lose balance.    1) lift up on toes, start with 5x per day and work up to 20x   2) stand and lift on leg straight out to the side so that foot is a few inches of the floor, start with 5x each side and work up to 20x each side   3) stand on one foot, start with 5 seconds each side and work up to 20 seconds on each side  If you need ideas or help with getting more active:  -Silver sneakers https://tools.silversneakers.com  -Walk with a Doc: http://stephens-thompson.biz/  -try to include resistance (weight lifting/strength building) and balance exercises twice per week: or the following link for ideas: ChessContest.fr  UpdateClothing.com.cy  STRESS MANAGEMENT - so important for health and well being -try meditating, or just sitting quietly with deep breathing while intentionally relaxing all parts of your body for 5 minutes daily  SOCIAL CONNECTIONS: -options in [place] if you wish to engage in more social and exercise related activities:  -Silver sneakers https://tools.silversneakers.com  -Walk with a Doc: http://stephens-thompson.biz/  -Check out the [place] 50+ section on the [place] of [employer] (hiking clubs, book clubs, cards and games, chess, exercise classes, aquatic classes and much more) - see the website for  details: https://www.-[place].gov/departments/parks-recreation/active-adults50  -YouTube has lots of exercise videos for different ages and abilities as well  -[place] (a variety of indoor and outdoor inperson activities for adults). 902-275-1967. [address]  -Virtual Online Classes (a variety of topics): see seniorplanet.org or call (601)456-5772  -consider volunteering at a school, hospice center, church, senior center or elsewhere

## 2022-06-21 NOTE — Progress Notes (Signed)
PATIENT CHECK-IN and HEALTH RISK ASSESSMENT QUESTIONNAIRE:  -completed by phone for upcoming Medicare Preventive Visit  Pre-Visit Check-in: 1)Vitals (height, wt, BP, etc) - record in vitals section for visit on day of visit 2)Review and Update Medications, Allergies PMH, Surgeries, Social history in Epic 3)Hospitalizations in the last year with date/reason?  no  4)Review and Update Care Team (patient's specialists) in Epic 5) Complete PHQ9 in Epic  6) Complete Fall Screening in Epic 7)Review all Health Maintenance Due and order under PCP if not done.  8)Medicare Wellness Questionnaire: Answer theses question about your habits: Do you drink alcohol? No  I Have you ever smoked?yes  How many packs a day do/did you smoke?  Half pack daily,  30years, has smoked less at times, but did smoke 1ppd for > 20 years,  she has tried to quit but has not been successful the last 2 years, she has cut back to 10 cigarettes, she is not quite ready to set a quit date yet but she is thinking Do you use smokeless tobacco?no Do you use an illicit drugs? No  Do you exercises?  No, but reports is pretty active doing all of her housework and some of her yard work,  Are you sexually active? No Number of partners? 0 Typical breakfast  blueberry muffin and mashed up banana or cereal  Typical lunch varies Typical dinner meat and 1 veg Typical snacks: chocolate chip cookie and chocolate milk or popcorn   Beverages: chocolate milk and a dr pepper at dinner, water.  Answer theses question about you: Can you perform most household chores? yes Do you find it hard to follow a conversation in a noisy room? No  Do you often ask people to speak up or repeat themselves? No  Do you feel that you have a problem with memory? No  Do you balance your checkbook and or bank acounts? yes Do you feel safe at home? Yes  Last dentist visit? Not sure Do you need assistance with any of the following: Please note if so no help  needed except does not drive much - husband used to drive and he passed away, does have family members who help her  Driving? does not drive  Feeding yourself?  Getting from bed to chair?  Getting to the toilet?  Bathing or showering?  Dressing yourself?  Managing money?  Climbing a flight of stairs  Preparing meals?  Do you have Advanced Directives in place (Living Will, Healthcare Power or Attorney)? yes   Last eye Exam and location? 3 years ago    Do you currently use prescribed or non-prescribed narcotic or opioid pain medications? No   Do you have a history or close family history of breast, ovarian, tubal or peritoneal cancer or a family member with BRCA (breast cancer susceptibility 1 and 2) gene mutations? No   Nurse/Assistant Credentials/time stamp: [name]. 10:25 am 06/21/22   ----------------------------------------------------------------------------------------------------------------------------------------------------------------------------------------------------------------------   MEDICARE ANNUAL PREVENTIVE VISIT WITH PROVIDER: (Welcome to [facility], initial annual wellness or annual wellness exam)  Virtual Visit via Phone Note  I connected with [name]  on 06/21/2022 by a video enabled telemedicine application and verified that I am speaking with the correct person using two identifiers.  Location patient: home Location provider:work or home office Persons participating in the virtual visit: patient, provider  Concerns and/or follow up today: none   See HM section in Epic for other details of completed HM.    ROS: negative for report of fevers, unintentional weight  loss, vision changes, vision loss, hearing loss or change, chest pain, sob, hemoptysis, melena, hematochezia, hematuria, genital discharge or lesions, falls, bleeding or bruising, loc, thoughts of suicide or self harm, memory loss  Patient-completed extensive health risk assessment -  reviewed and discussed with the patient: See Health Risk Assessment completed with patient prior to the visit either above or in recent phone note. This was reviewed in detailed with the patient today and appropriate recommendations, orders and referrals were placed as needed per Summary below and patient instructions.   Review of Medical History: -PMH, PSH, Family History and current specialty and care providers reviewed and updated and listed below   Patient Care Team: Colon Branch, MD as PCP - General (Internal Medicine)   Past Medical History:  Diagnosis Date   Anxiety    Anxiety and depression 07/23/2013   Depression    h/o early 1980's- not currently   Osteoporosis     Past Surgical History:  Procedure Laterality Date   surgery     R arm and shoulder   TUBAL LIGATION      Social History   Socioeconomic History   Marital status: Widowed    Spouse name: Not on file   Number of children: 0   Years of education: Not on file   Highest education level: Not on file  Occupational History   Occupation: retired, catering   Tobacco Use   Smoking status: Every Day    Packs/day: 0.75    Types: Cigarettes   Smokeless tobacco: Never   Tobacco comments:     < 1 ppd   Substance and Sexual Activity   Alcohol use: No   Drug use: No   Sexual activity: Not on file  Other Topics Concern   Not on file  Social History Narrative   High School education.   Lost husband [name])  05-2017: [place], [place]    Father lives in a ALF in [place], [place]   2 brothers, they are not local, [name] call qd    Has a nephew in [place] (he drives patient)   [name] is her best friend        Social Determinants of Health   Financial Resource Strain: Low Risk  (06/13/2021)   Overall Financial Resource Strain (CARDIA)    Difficulty of Paying Living Expenses: Not hard at all  Food Insecurity: No Food Insecurity (06/13/2021)   Hunger Vital Sign    Worried About Running Out of Food in the Last Year: Never  true    [place] in the Last Year: Never true  Transportation Needs: No Transportation Needs (06/13/2021)   PRAPARE - Hydrologist (Medical): No    Lack of Transportation (Non-Medical): No  Physical Activity: Inactive (06/13/2021)   Exercise Vital Sign    Days of Exercise per Week: 0 days    Minutes of Exercise per Session: 0 min  Stress: No Stress Concern Present (06/13/2021)   [place]    Feeling of Stress : Not at all  Social Connections: Moderately Isolated (06/13/2021)   Social Connection and Isolation Panel [NHANES]    Frequency of Communication with Friends and Family: More than three times a week    Frequency of Social Gatherings with Friends and Family: Once a week    Attends Religious Services: 1 to 4 times per year    Active Member of Clubs or Organizations: No  Attends Archivist Meetings: Never    Marital Status: Widowed  Intimate Partner Violence: Not At Risk (06/13/2021)   Humiliation, Afraid, Rape, and Kick questionnaire    Fear of Current or Ex-Partner: No    Emotionally Abused: No    Physically Abused: No    Sexually Abused: No    Family History  Problem Relation Age of Onset   Breast cancer Mother    Dementia Mother    CAD Other        MI GF age 82   Diabetes Father    Ovarian cancer Maternal Grandmother    Colon cancer Neg Hx    Colon polyps Neg Hx    Esophageal cancer Neg Hx    Rectal cancer Neg Hx    Stomach cancer Neg Hx     Current Outpatient Medications on File Prior to Visit  Medication Sig Dispense Refill   cholecalciferol (VITAMIN D) 1000 UNITS tablet Take 1,000 Units by mouth daily.      escitalopram (LEXAPRO) 20 MG tablet Take 1 tablet (20 mg total) by mouth daily. 90 tablet 3   Multiple Vitamin (MULTIVITAMIN WITH MINERALS) TABS tablet Take 1 tablet by mouth daily.     atorvastatin (LIPITOR) 10 MG tablet Take 1 tablet (10 mg  total) by mouth at bedtime. (Patient not taking: Reported on 06/21/2022) 90 tablet 3   No current facility-administered medications on file prior to visit.    No Known Allergies     Physical Exam There were no vitals filed for this visit. Estimated body mass index is 21.78 kg/m as calculated from the following:   Height as of 01/08/22: _0  (1.549 m).   Weight as of 01/08/22: 115 lb 4 oz (52.3 kg).  EKG (optional): deferred due to virtual visit  GENERAL: alert, oriented, no audible sounds of distress, vision exam deferred due to audio visit  PSYCH/NEURO: pleasant and cooperative, no obvious depression or anxiety, speech and thought processing grossly intact, Cognitive function grossly intact  [place] Office Visit from 01/08/2022 in [place] at [facility]  PHQ-9 Total Score 0           06/21/2022   10:19 AM 01/08/2022   10:27 AM 06/13/2021    1:30 PM 01/06/2021   11:14 AM 02/17/2020   11:52 AM  Depression screen PHQ 2/9  Decreased Interest 0 0 0 0 0  Down, Depressed, Hopeless 0 0 0 0 0  PHQ - 2 Score 0 0 0 0 0  Altered sleeping  0  0   Tired, decreased energy  0  0   Change in appetite  0  0   Feeling bad or failure about yourself   0  0   Trouble concentrating  0  0   Moving slowly or fidgety/restless    0   Suicidal thoughts    0   PHQ-9 Score  0  0        02/17/2020   11:52 AM 01/06/2021   10:35 AM 06/13/2021    1:33 PM 01/08/2022   10:00 AM 06/21/2022   10:18 AM  Fall Risk  Falls in the past year? 0 0 0 0 0  Was there an injury with Fall? 0 0 0 0 0  Fall Risk Category Calculator 0 0 0 0 0  Fall Risk Category _1   Patient Fall Risk Level Low fall risk Low fall risk  Low fall risk Low fall  risk  Patient at Risk for Falls Due to   Impaired vision  Other (Comment)  Fall risk Follow up Falls prevention discussed;Education provided  Falls prevention discussed Falls evaluation completed Falls evaluation completed      SUMMARY AND PLAN:  Medicare annual wellness visit, subsequent   Discussed applicable health maintenance/preventive health measures and advised and referred or ordered per patient preferences:  Health Maintenance  Topic Date Due   DTaP/Tdap/Td (1 - Tdap) 07/24/2013, she is considering, let her know could get at pharmacy or PCP office   Zoster Vaccines- Shingrix (1 of 2) 01/09/2023 (Originally 05/31/1997), she thinks she had this and plans to check   Medicare Annual Wellness (AWV)  06/22/2023   Pneumonia Vaccine 43+ Years old  Completed   INFLUENZA VACCINE  Completed   DEXA SCAN  Completed   Hepatitis C Screening  Completed   HPV VACCINES  Aged Out   MAMMOGRAM  Discontinued, not sure why discontinued, advise can schedule if she wishes   COLONOSCOPY (Pts 45-84yr Insurance coverage will need to be confirmed)  Discontinued   COVID-19 Vaccine  Discontinued   Fecal DNA (Cologuard)  Discontinued   She qualifies for lung cancer screening as smoking > 30 year, > 1 ppd, just cut back in last 2 years. Advised lung cancer screening, but she plans to think about this. She agrees to contact her PCP office if decides to do it. She did in the past and I reviewed that with her.   She is due for bone density screening, She also is not yet ready to schedule this. Advised to call when ready.   Education and counseling on the following was provided based on the above review of health and a plan/checklist for the patient, along with additional information discussed, was provided for the patient in the patient instructions :   -Advised and counseled on maintaining healthy weight and healthy lifestyle - including the importance of a health diet, regular physical activity, social connections and stress management. -Advised and counseled on a whole foods based healthy diet and regular exercise: discussed a heart healthy whole foods based diet at length. A summary of a healthy diet was provided in the  Patient Instructions. Recommended regular exercise and discussed options within the community.  -Advised yearly dental visits at minimum and regular eye exams -Advised and counseled on tobacco use. Counseled at length as is in contemplation phase. She has agreed to calling the quit line and considering setting a quit date. Let her know about medications that could help and that she could follow up with me or her PCP 1 week before quit date if decides to quit and we can help. Advised of risks, benefits of quitting now and discussed and advised lung ca screening.   Follow up: see patient instructions     Patient Instructions  I really enjoyed getting to talk with you today! I am available on Tuesdays and Thursdays for virtual visits if you have any questions or concerns, or if I can be of any further assistance.   CHECKLIST FROM ANNUAL WELLNESS VISIT:  -Follow up (please call to schedule if not scheduled after visit):  -Inperson visit with your Primary Doctor office: in 3-4 months or as scheduled -yearly for annual wellness visit with primary care office  Here is a list of your preventive care/health maintenance measures and the plan for each if any are due:  Health Maintenance  Topic Date Due   DTaP/Tdap/Td (1 - Tdap) 07/24/2013, can  get at pharmacy or doctor office   Zoster Vaccines- Shingrix (1 of 2) 01/09/2023 (Originally 05/31/1997), please check your records and if you already had this provide updated record. If not, it is something you can get at the pharmacy or with your doctor if you wish.    Medicare Annual Wellness (AWV)  06/22/2023   Pneumonia Vaccine 42+ Years old  Completed   INFLUENZA VACCINE  Completed   DEXA SCAN  Due for this now, call office if you decide to due.    Hepatitis C Screening  Completed   HPV VACCINES  Aged Out   MAMMOGRAM  if you wish to do this please let your doctor know.    COLONOSCOPY (Pts 45-38yr Insurance coverage will need to be confirmed)   Discontinued   COVID-19 Vaccine  Discontinued   Fecal DNA (Cologuard)  Discontinued  Due for Lung Cancer screening as we discussed. Please call your doctor if you decide to do this.   -See a dentist at least yearly  -Get your eyes checked and then per your eye specialist's recommendations  -Other issues addressed today:  -smoking cessation - 1-800-QUIT-Now I'm so glad you are interested in quitting smoking! Please call the quit line, set a quit date and call [place] a follow up 1 week before your quit date if you would like further assistance.   -I have included below further information regarding a healthy whole foods based diet, physical activity guidelines for adults, stress management and opportunities for social connections. I hope you find this information useful.   -----------------------------------------------------------------------------------------------------------------------------------------------------------------------------------------------------------------------------------------------------------  FOOD - THE FUEL FOR A HAPPY HEALTHY LIFE: -eat real food: lots of colorful vegetables (half the plate) and fruits -5-7 servings of vegetables and fruits per day (fresh or steamed is best), exp. 2 servings of vegetables with lunch and dinner and 2 servings of fruit per day. Berries and greens such as kale and collards are great choices.  -consume on a regular basis: whole grains (make sure first ingredient on label contains the word "whole"), fresh fruits, fish, nuts, seeds, healthy oils (such as olive oil, avocado oil, grape seed oil) -may eat small amounts of dairy and lean meat on occasion, but avoid processed meats such as ham, bacon, lunch meat, etc. -drink water -try to avoid fast food and pre-packaged foods, processed meat -most experts advise limiting sodium to < 23089mper day, should limit further is any chronic conditions such as high blood pressure, heart disease,  diabetes, etc. The American Heart Association advised that < 150057ms is ideal -try to avoid foods that contain any ingredients with names you do not recognize  -try to avoid sugar/sweets (except for the natural sugar that occurs in fresh fruit) -try to avoid sweet drinks -try to avoid white rice, white bread, pasta (unless whole grain), white or yellow potatoes  MOVE - the key to keeping your body moving and working best: -if you wish to increase your physical activity, do so gradually and with the approval of your doctor -STOP and seek medical care immediately if you have any chest pain, chest discomfort or trouble breathing when starting or increasing exercise  -move and stretch your body, legs, feet and arms when sitting for long periods -Physical activity guidelines for optimal health in adults: -least 150 minutes per week of aerobic exercise (can talk, but not sing) once approved by your doctor, 20-30 minutes of sustained activity or two 10 minute episodes of sustained activity every day.  -resistance training at  least 2 days per week if approved by your doctor -balance exercises 3+ days per week:   Stand somewhere where you have something sturdy to hold onto if you lose balance.    1) lift up on toes, start with 5x per day and work up to 20x   2) stand and lift on leg straight out to the side so that foot is a few inches of the floor, start with 5x each side and work up to 20x each side   3) stand on one foot, start with 5 seconds each side and work up to 20 seconds on each side  If you need ideas or help with getting more active:  -Silver sneakers https://tools.silversneakers.com  -Walk with a Doc: http://stephens-thompson.biz/  -try to include resistance (weight lifting/strength building) and balance exercises twice per week: or the following link for  ideas: ChessContest.fr  UpdateClothing.com.cy  STRESS MANAGEMENT - so important for health and well being -try meditating, or just sitting quietly with deep breathing while intentionally relaxing all parts of your body for 5 minutes daily  SOCIAL CONNECTIONS: -options in [place] if you wish to engage in more social and exercise related activities:  -Silver sneakers https://tools.silversneakers.com  -Walk with a Doc: http://stephens-thompson.biz/  -Check out the [employer] 50+ section on the [place] [employer] (hiking clubs, book clubs, cards and games, chess, exercise classes, aquatic classes and much more) - see the website for details: https://www.[place].gov/departments/parks-recreation/active-adults50  -YouTube has lots of exercise videos for different ages and abilities as well  -[place] (a variety of indoor and outdoor inperson activities for adults). (878) 724-4548. [address].  -Virtual Online Classes (a variety of topics): see seniorplanet.org or call [phone]  -consider volunteering at a school, hospice center, church, senior center or elsewhere           Lucretia Kern, DO

## 2022-12-28 ENCOUNTER — Other Ambulatory Visit: Payer: Self-pay | Admitting: Internal Medicine

## 2023-01-10 ENCOUNTER — Encounter: Payer: Medicare Other | Admitting: Internal Medicine

## 2023-01-16 ENCOUNTER — Ambulatory Visit (INDEPENDENT_AMBULATORY_CARE_PROVIDER_SITE_OTHER): Payer: Medicare Other | Admitting: Internal Medicine

## 2023-01-16 ENCOUNTER — Encounter: Payer: Self-pay | Admitting: Internal Medicine

## 2023-01-16 VITALS — BP 120/82 | HR 71 | Temp 98.0°F | Resp 16 | Ht 61.0 in | Wt 111.4 lb

## 2023-01-16 DIAGNOSIS — E785 Hyperlipidemia, unspecified: Secondary | ICD-10-CM

## 2023-01-16 DIAGNOSIS — Z0000 Encounter for general adult medical examination without abnormal findings: Secondary | ICD-10-CM | POA: Diagnosis not present

## 2023-01-16 LAB — CBC WITH DIFFERENTIAL/PLATELET
Basophils Absolute: 0 10*3/uL (ref 0.0–0.1)
Basophils Relative: 0.5 % (ref 0.0–3.0)
Eosinophils Absolute: 0.1 10*3/uL (ref 0.0–0.7)
Eosinophils Relative: 0.7 % (ref 0.0–5.0)
HCT: 38.7 % (ref 36.0–46.0)
Hemoglobin: 13.2 g/dL (ref 12.0–15.0)
Lymphocytes Relative: 15.6 % (ref 12.0–46.0)
Lymphs Abs: 1.3 10*3/uL (ref 0.7–4.0)
MCHC: 34.1 g/dL (ref 30.0–36.0)
MCV: 97.2 fl (ref 78.0–100.0)
Monocytes Absolute: 0.3 10*3/uL (ref 0.1–1.0)
Monocytes Relative: 3.7 % (ref 3.0–12.0)
Neutro Abs: 6.6 10*3/uL (ref 1.4–7.7)
Neutrophils Relative %: 79.5 % — ABNORMAL HIGH (ref 43.0–77.0)
Platelets: 163 10*3/uL (ref 150.0–400.0)
RBC: 3.98 Mil/uL (ref 3.87–5.11)
RDW: 12.6 % (ref 11.5–15.5)
WBC: 8.3 10*3/uL (ref 4.0–10.5)

## 2023-01-16 LAB — BASIC METABOLIC PANEL
BUN: 13 mg/dL (ref 6–23)
CO2: 33 mEq/L — ABNORMAL HIGH (ref 19–32)
Calcium: 10.3 mg/dL (ref 8.4–10.5)
Chloride: 102 mEq/L (ref 96–112)
Creatinine, Ser: 0.87 mg/dL (ref 0.40–1.20)
GFR: 65.08 mL/min (ref >60.00)
Glucose, Bld: 96 mg/dL (ref 70–99)
Potassium: 3.9 mEq/L (ref 3.5–5.1)
Sodium: 143 mEq/L (ref 135–145)

## 2023-01-16 MED ORDER — ESCITALOPRAM OXALATE 20 MG PO TABS: 20.0000 mg | ORAL_TABLET | Freq: Every day | ORAL | 3 refills | Status: DC

## 2023-01-16 NOTE — Assessment & Plan Note (Signed)
Here for CPX Hyperlipidemia 10-year CV risk 18.3%, recommended atorvastatin, patient declined .  Explained the benefits of statins, still declines. Will stop checking FLP's. Anxiety depression: Well-controlled Osteoporosis: Declines bone density test. Compliance:  I explained pro-cons-benefits of meds such as Lipitor and screenings, she is declining many interventions but said that she understood why I recommended them.  I respect her decision. Social: Lives alone, does not drive due to anxiety, family members are very supportive RTC 1 year

## 2023-01-16 NOTE — Progress Notes (Signed)
Subjective:    Patient ID: [name], female    DOB: 1946/02/14, 77 y.o.   MRN: [id number]  DOS:  01/16/2023 Type of visit - description: CPX  Here for CPX. In general feels well. She specifically denies chest pain, difficulty breathing, GI or GU symptoms.  Review of Systems   A 14 point review of systems is negative    Past Medical History:  Diagnosis Date   Anxiety    Anxiety and depression 07/23/2013   Depression    h/o early 1980's- not currently   Osteoporosis     Past Surgical History:  Procedure Laterality Date   surgery     R arm and shoulder   TUBAL LIGATION     Social History   Socioeconomic History   Marital status: Widowed    Spouse name: Not on file   Number of children: 0   Years of education: Not on file   Highest education level: Not on file  Occupational History   Occupation: retired, catering   Tobacco Use   Smoking status: Every Day    Packs/day: .75    Types: Cigarettes   Smokeless tobacco: Never   Tobacco comments:    1/2  ppd   Substance and Sexual Activity   Alcohol use: No   Drug use: No   Sexual activity: Not on file  Other Topics Concern   Not on file  Social History Narrative   Lives by herself   Does not drive   High School education.   Lost husband Norva Pavlov)  05-2017: [place], [place]    Father lives in a ALF in [place], [address]   2 brothers, they are not local, Brett Canales call qd    Has a nephew in [place] (he drives patient)   Drue Flirt is her best friend        Social Determinants of Health   Financial Resource Strain: Low Risk  (06/13/2021)   Overall Financial Resource Strain (CARDIA)    Difficulty of Paying Living Expenses: Not hard at all  Food Insecurity: No Food Insecurity (06/13/2021)   Hunger Vital Sign    Worried About Running Out of Food in the Last Year: Never true    Ran Out of Food in the Last Year: Never true  Transportation Needs: No Transportation Needs (06/13/2021)   PRAPARE - Scientist, research (physical sciences) (Medical): No    Lack of Transportation (Non-Medical): No  Physical Activity: Inactive (06/13/2021)   Exercise Vital Sign    Days of Exercise per Week: 0 days    Minutes of Exercise per Session: 0 min  Stress: No Stress Concern Present (06/13/2021)   [employer] of Occupational Health - Occupational Stress Questionnaire    Feeling of Stress : Not at all  Social Connections: Moderately Isolated (06/13/2021)   Social Connection and Isolation Panel [NHANES]    Frequency of Communication with Friends and Family: More than three times a week    Frequency of Social Gatherings with Friends and Family: Once a week    Attends Religious Services: 1 to 4 times per year    Active Member of [employer] or Organizations: No    Attends Banker Meetings: Never    Marital Status: Widowed  Intimate Partner Violence: Not At Risk (06/13/2021)   Humiliation, Afraid, Rape, and Kick questionnaire    Fear of Current or Ex-Partner: No    Emotionally Abused: No    Physically Abused: No  Sexually Abused: No    Current Outpatient Medications  Medication Instructions   cholecalciferol (VITAMIN D) 1,000 Units, Oral, Daily   escitalopram (LEXAPRO) 20 mg, Oral, Daily   Multiple Vitamin (MULTIVITAMIN WITH MINERALS) TABS tablet 1 tablet, Oral, Daily       Objective:   Physical Exam BP 120/82   Pulse 71   Temp 98 F (36.7 C) (Oral)   Resp 16   Ht 5\' 1"  (1.549 m)   Wt 111 lb 6 oz (50.5 kg)   SpO2 97%   BMI 21.04 kg/m  General: Well developed, NAD, BMI noted Neck: No  thyromegaly  HEENT:  Normocephalic . Face symmetric, atraumatic Lungs:  CTA B Normal respiratory effort, no intercostal retractions, no accessory muscle use. Heart: RRR,  no murmur.  Abdomen:  Not distended, soft, non-tender. No rebound or rigidity.  Palpable, nontender aorta without bruit Lower extremities: no pretibial edema bilaterally  Skin: Exposed areas without rash. Not pale. Not  jaundice Neurologic:  alert & oriented X3.  Speech normal, gait appropriate for age and unassisted Strength symmetric and appropriate for age.  Psych: Cognition and judgment appear intact.  Cooperative with normal attention span and concentration.  Behavior appropriate. No anxious or depressed appearing.     Assessment    ASSESSMENT Dyslipidemia: See OV 12/2021. Anxiety (fear of ridding a car), depression Osteoporosis t score -2.9 (09-2013). Started Fosamax 2015   Abnormal Lung Ca screening CT 09-2016, recheck CT 12-2016: findings resolved ; pulmonary rec annual CTs, next 12-2017 Does not drive   PLAN Here for CPX - Td 07-2013 -PNM shot 07-2013; prevnar--2016 - zostavax: 09-2013 Vaccines are recommended: COVID, Shingrix, RSV, PNM 20, flu shot every fall (declines) ---No further PAPs, see comments from 2017  -- Breast cancer, lung cancer and Colon cancer screening: Declines --H/o palpable abdominal aorta, neg  Ultrasound 2015 --- Labs:   BMP, CBC --- H-POA: see AVS Hyperlipidemia 10-year CV risk 18.3%, recommended atorvastatin, patient declined .  Explained the benefits of statins, still declines. Will stop checking FLP's. Anxiety depression: Well-controlled Osteoporosis: Declines bone density test. Compliance:  I explained pro-cons-benefits of meds such as Lipitor and screenings, she is declining many interventions but said that she understood why I recommended them.  I respect her decision. Social: Lives alone, does not drive due to anxiety, family members are very supportive RTC 1 year

## 2023-01-16 NOTE — Patient Instructions (Addendum)
Please read information about fall prevention.  Vaccines I recommend: Shingrix (shingles) Tdap (tetanus) RSV vaccine Flu shot this fall PNM 20 COVID-vaccine  Please bring [place] a copy of your Healthcare Power of Attorney for your chart.    GO TO THE LAB : Get the blood work     GO TO THE FRONT DESK, PLEASE SCHEDULE YOUR APPOINTMENTS Come back for a physical exam in 1 year    Fall Prevention in the Home, Adult Falls can cause injuries and affect people of all ages. There are many simple things that you can do to make your home safe and to help prevent falls. If you need it, ask for help making these changes. What actions can I take to prevent falls? General information Use good lighting in all rooms. Make sure to: Replace any light bulbs that burn out. Turn on lights if it is dark and use night-lights. Keep items that you use often in easy-to-reach places. Lower the shelves around your home if needed. Move furniture so that there are clear paths around it. Do not keep throw rugs or other things on the floor that can make you trip. If any of your floors are uneven, fix them. Add color or contrast paint or tape to clearly mark and help you see: Grab bars or handrails. First and last steps of staircases. Where the edge of each step is. If you use a ladder or stepladder: Make sure that it is fully opened. Do not climb a closed ladder. Make sure the sides of the ladder are locked in place. Have someone hold the ladder while you use it. Know where your pets are as you move through your home. What can I do in the bathroom?     Keep the floor dry. Clean up any water that is on the floor right away. Remove soap buildup in the bathtub or shower. Buildup makes bathtubs and showers slippery. Use non-skid mats or decals on the floor of the bathtub or shower. Attach bath mats securely with double-sided, non-slip rug tape. If you need to sit down while you are in the shower, use a  non-slip stool. Install grab bars by the toilet and in the bathtub and shower. Do not use towel bars as grab bars. What can I do in the bedroom? Make sure that you have a light by your bed that is easy to reach. Do not use any sheets or blankets on your bed that hang to the floor. Have a firm bench or chair with side arms that you can use for support when you get dressed. What can I do in the kitchen? Clean up any spills right away. If you need to reach something above you, use a sturdy step stool that has a grab bar. Keep electrical cables out of the way. Do not use floor polish or wax that makes floors slippery. What can I do with my stairs? Do not leave anything on the stairs. Make sure that you have a light switch at the top and the bottom of the stairs. Have them installed if you do not have them. Make sure that there are handrails on both sides of the stairs. Fix handrails that are broken or loose. Make sure that handrails are as long as the staircases. Install non-slip stair treads on all stairs in your home if they do not have carpet. Avoid having throw rugs at the top or bottom of stairs, or secure the rugs with carpet tape to prevent them  from moving. Choose a carpet design that does not hide the edge of steps on the stairs. Make sure that carpet is firmly attached to the stairs. Fix any carpet that is loose or worn. What can I do on the outside of my home? Use bright outdoor lighting. Repair the edges of walkways and driveways and fix any cracks. Clear paths of anything that can make you trip, such as tools or rocks. Add color or contrast paint or tape to clearly mark and help you see high doorway thresholds. Trim any bushes or trees on the main path into your home. Check that handrails are securely fastened and in good repair. Both sides of all steps should have handrails. Install guardrails along the edges of any raised decks or porches. Have leaves, snow, and ice cleared  regularly. Use sand, salt, or ice melt on walkways during winter months if you live where there is ice and snow. In the garage, clean up any spills right away, including grease or oil spills. What other actions can I take? Review your medicines with your health care provider. Some medicines can make you confused or feel dizzy. This can increase your chance of falling. Wear closed-toe shoes that fit well and support your feet. Wear shoes that have rubber soles and low heels. Use a cane, walker, scooter, or crutches that help you move around if needed. Talk with your provider about other ways that you can decrease your risk of falls. This may include seeing a physical therapist to learn to do exercises to improve movement and strength. Where to find more information Centers for Disease Control and Prevention, STEADI: TonerPromos.no [employer] on Aging: BaseRingTones.pl National Institute on Aging: BaseRingTones.pl Contact a health care provider if: You are afraid of falling at home. You feel weak, drowsy, or dizzy at home. You fall at home. Get help right away if you: Lose consciousness or have trouble moving after a fall. Have a fall that causes a head injury. These symptoms may be an emergency. Get help right away. Call 911. Do not wait to see if the symptoms will go away. Do not drive yourself to the hospital. This information is not intended to replace advice given to you by your health care provider. Make sure you discuss any questions you have with your health care provider. Document Revised: 03/05/2022 Document Reviewed: 03/05/2022 Elsevier Patient Education  2024 [employer].

## 2023-01-16 NOTE — Assessment & Plan Note (Signed)
-   Td 07-2013 -PNM shot 07-2013; prevnar--2016 - zostavax: 09-2013 Vaccines are recommended: COVID, Shingrix, RSV, PNM 20, flu shot every fall (declines) ---No further PAPs, see comments from 2017  -- Breast cancer, lung cancer and Colon cancer screening: Declines --H/o palpable abdominal aorta, neg  Ultrasound 2015 --- Labs:   BMP, CBC --- H-POA: see AVS

## 2023-06-18 ENCOUNTER — Telehealth: Payer: Self-pay | Admitting: Internal Medicine

## 2023-06-18 NOTE — Telephone Encounter (Signed)
Copied from CRM [id number]. Topic: Medicare AWV >> Jun 18, 2023  2:36 PM Payton Doughty wrote: Reason for CRM: Called LVM 06/18/2023 to schedule Annual Wellness Visit  Verlee Rossetti; Care Guide Ambulatory Clinical Support [place] [facility] Direct Dial: [phone]

## 2023-07-02 ENCOUNTER — Ambulatory Visit (INDEPENDENT_AMBULATORY_CARE_PROVIDER_SITE_OTHER): Payer: Medicare Other

## 2023-07-02 VITALS — Ht 61.0 in | Wt 111.0 lb

## 2023-07-02 DIAGNOSIS — Z0000 Encounter for general adult medical examination without abnormal findings: Secondary | ICD-10-CM

## 2023-07-02 NOTE — Patient Instructions (Addendum)
[name] , Thank you for taking time to come for your Medicare Wellness Visit. I appreciate your ongoing commitment to your health goals. Please review the following plan we discussed and let me know if I can assist you in the future.   Referrals/Orders/Follow-Ups/Clinician Recommendations:   This is a list of the screening recommended for you and due dates:  Health Maintenance  Topic Date Due   Zoster (Shingles) Vaccine (1 of 2) 05/31/1997   DTaP/Tdap/Td vaccine (1 - Tdap) 07/24/2013   Flu Shot  10/14/2023*   Medicare Annual Wellness Visit  07/01/2024   Pneumonia Vaccine  Completed   DEXA scan (bone density measurement)  Completed   Hepatitis C Screening  Completed   HPV Vaccine  Aged Out   Mammogram  Discontinued   Colon Cancer Screening  Discontinued   COVID-19 Vaccine  Discontinued   Cologuard (Stool DNA test)  Discontinued  *Topic was postponed. The date shown is not the original due date.    Advanced directives: (Copy Requested) Please bring a copy of your health care power of attorney and living will to the office to be added to your chart at your convenience.  Next Medicare Annual Wellness Visit scheduled for next year: Yes

## 2023-07-02 NOTE — Progress Notes (Signed)
Subjective:   [name] is a 77 y.o. female who presents for Medicare Annual (Subsequent) preventive examination.  Visit Complete: Virtual I connected with  TELLIE HACKLEY on 07/02/23 by a audio enabled telemedicine application and verified that I am speaking with the correct person using two identifiers.  Patient Location: Home  Provider Location: Home Office  I discussed the limitations of evaluation and management by telemedicine. The patient expressed understanding and agreed to proceed.  Vital Signs: Because this visit was a virtual/telehealth visit, some criteria may be missing or patient reported. Any vitals not documented were not able to be obtained and vitals that have been documented are patient reported.    Cardiac Risk Factors include: advanced age (>22men, >50 women);smoking/ tobacco exposure     Objective:    Today's Vitals   07/02/23 0943  Weight: 111 lb (50.3 kg)  Height: 5\' 1"  (1.549 m)   Body mass index is 20.97 kg/m.     07/02/2023    9:50 AM 06/13/2021    1:32 PM 02/17/2020   11:48 AM 12/26/2016   10:43 AM  Advanced Directives  Does Patient Have a Medical Advance Directive? Yes Yes Yes No  Type of Estate agent of [place];Living will Healthcare Power of [employer] of [place];Living will   Does patient want to make changes to medical advance directive?   No - Patient declined   Copy of Healthcare Power of Attorney in Chart? No - copy requested No - copy requested No - copy requested     Current Medications (verified) Outpatient Encounter Medications as of 07/02/2023  Medication Sig   cholecalciferol (VITAMIN D) 1000 UNITS tablet Take 1,000 Units by mouth daily.    escitalopram (LEXAPRO) 20 MG tablet Take 1 tablet (20 mg total) by mouth daily.   Multiple Vitamin (MULTIVITAMIN WITH MINERALS) TABS tablet Take 1 tablet by mouth daily.   No facility-administered encounter medications on file as of 07/02/2023.     Allergies (verified) Patient has no known allergies.   History: Past Medical History:  Diagnosis Date   Anxiety    Anxiety and depression 07/23/2013   Depression    h/o early 28's- not currently   Osteoporosis    Past Surgical History:  Procedure Laterality Date   surgery     R arm and shoulder   TUBAL LIGATION     Family History  Problem Relation Age of Onset   Breast cancer Mother    Dementia Mother    Diabetes Father        born 70   Ovarian cancer Maternal Grandmother    CAD Other        MI GF age 43   Colon cancer Neg Hx    Colon polyps Neg Hx    Esophageal cancer Neg Hx    Rectal cancer Neg Hx    Stomach cancer Neg Hx    Social History   Socioeconomic History   Marital status: Widowed    Spouse name: Not on file   Number of children: 0   Years of education: Not on file   Highest education level: Not on file  Occupational History   Occupation: retired, catering   Tobacco Use   Smoking status: Every Day    Current packs/day: 0.75    Types: Cigarettes   Smokeless tobacco: Never   Tobacco comments:    1/2  ppd   Substance and Sexual Activity   Alcohol use: No  Drug use: No   Sexual activity: Not on file  Other Topics Concern   Not on file  Social History Narrative   Lives by herself   Does not drive   High School education.   Lost husband Norva Pavlov)  05-2017: CAD, [place]    Father lives in a ALF in [place], [address]   2 brothers, they are not local, Brett Canales call qd    Has a nephew in [place] (he drives patient)   Drue Flirt is her best friend        Social Drivers of Corporate investment banker Strain: Low Risk  (07/02/2023)   Overall Financial Resource Strain (CARDIA)    Difficulty of Paying Living Expenses: Not hard at all  Food Insecurity: No Food Insecurity (07/02/2023)   Hunger Vital Sign    Worried About Running Out of Food in the Last Year: Never true    Ran Out of Food in the Last Year: Never true  Transportation Needs: No Transportation  Needs (07/02/2023)   PRAPARE - Administrator, Civil Service (Medical): No    Lack of Transportation (Non-Medical): No  Physical Activity: Inactive (07/02/2023)   Exercise Vital Sign    Days of Exercise per Week: 0 days    Minutes of Exercise per Session: 0 min  Stress: No Stress Concern Present (07/02/2023)   [employer] of Occupational Health - Occupational Stress Questionnaire    Feeling of Stress : Not at all  Social Connections: Moderately Integrated (07/02/2023)   Social Connection and Isolation Panel [NHANES]    Frequency of Communication with Friends and Family: More than three times a week    Frequency of Social Gatherings with Friends and Family: More than three times a week    Attends Religious Services: More than 4 times per year    Active Member of [employer] or Organizations: Yes    Attends Banker Meetings: More than 4 times per year    Marital Status: Widowed    Tobacco Counseling Ready to quit: Yes Counseling given: Yes Tobacco comments: 1/2  ppd    Clinical Intake:  Pre-visit preparation completed: Yes  Pain : No/denies pain     BMI - recorded: 20.97 Nutritional Status: BMI of 19-24  Normal Nutritional Risks: None Diabetes: No  How often do you need to have someone help you when you read instructions, pamphlets, or other written materials from your doctor or pharmacy?: 1 - Never  Interpreter Needed?: No  Information entered by :: Theresa Mulligan LPN   Activities of Daily Living    07/02/2023    9:49 AM  In your present state of health, do you have any difficulty performing the following activities:  Hearing? 0  Vision? 0  Difficulty concentrating or making decisions? 0  Walking or climbing stairs? 0  Dressing or bathing? 0  Doing errands, shopping? 0  Preparing Food and eating ? N  Using the Toilet? N  In the past six months, have you accidently leaked urine? N  Do you have problems with loss of bowel control? N   Managing your Medications? N  Managing your Finances? N  Housekeeping or managing your Housekeeping? N    Patient Care Team: Wanda Plump, MD as PCP - General (Internal Medicine)  Indicate any recent Medical Services you may have received from other than [facility] providers in the past year (date may be approximate).     Assessment:   This is a routine wellness  examination for [place].  Hearing/Vision screen Hearing Screening - Comments:: Denies hearing difficulties   Vision Screening - Comments:: Wears reading glasses - Not up to date with routine eye exams with  Patient deferred   Goals Addressed               This Visit's Progress     Stay active (pt-stated)         Depression Screen    07/02/2023    9:48 AM 01/16/2023   11:02 AM 06/21/2022   10:19 AM 01/08/2022   10:27 AM 06/13/2021    1:30 PM 01/06/2021   11:14 AM 02/17/2020   11:52 AM  PHQ 2/9 Scores  PHQ - 2 Score 0 0 0 0 0 0 0  PHQ- 9 Score  2  0  0     Fall Risk    07/02/2023    9:50 AM 01/16/2023   10:28 AM 06/21/2022   10:18 AM 01/08/2022   10:00 AM 06/13/2021    1:33 PM  Fall Risk   Falls in the past year? 0 0 0 0 0  Number falls in past yr: 0 0 0 0 0  Injury with Fall? 0 0 0 0 0  Risk for fall due to : No Fall Risks  Other (Comment)  Impaired vision  Follow up Falls prevention discussed Falls evaluation completed Falls evaluation completed Falls evaluation completed Falls prevention discussed    MEDICARE RISK AT HOME: Medicare Risk at Home Any stairs in or around the home?: No If so, are there any without handrails?: No Home free of loose throw rugs in walkways, pet beds, electrical cords, etc?: Yes Adequate lighting in your home to reduce risk of falls?: No Life alert?: No Use of a cane, walker or w/c?: No Grab bars in the bathroom?: Yes Shower chair or bench in shower?: Yes Elevated toilet seat or a handicapped toilet?: No  TIMED UP AND GO:  Was the test performed?  No    Cognitive Function:         07/02/2023    9:50 AM 06/13/2021    1:35 PM  6CIT Screen  What Year? 0 points 0 points  What month? 0 points 0 points  What time? 0 points 0 points  Count back from 20 0 points 0 points  Months in reverse 0 points 0 points  Repeat phrase 0 points 6 points  Total Score 0 points 6 points    Immunizations Immunization History  Administered Date(s) Administered   Influenza Split 04/22/2014, 04/30/2018   Influenza, High Dose Seasonal PF 04/15/2013, 05/03/2017   Influenza-Unspecified 04/15/2014, 05/16/2016, 04/15/2018, 04/30/2021, 05/06/2022   Pneumococcal Conjugate-13 09/24/2014   Pneumococcal Polysaccharide-23 07/23/2013   Tetanus 07/23/2013   Zoster, Live 09/21/2013    TDAP status: Due, Education has been provided regarding the importance of this vaccine. Advised may receive this vaccine at local pharmacy or Health Dept. Aware to provide a copy of the vaccination record if obtained from local pharmacy or Health Dept. Verbalized acceptance and understanding.  Flu Vaccine status: Due, Education has been provided regarding the importance of this vaccine. Advised may receive this vaccine at local pharmacy or Health Dept. Aware to provide a copy of the vaccination record if obtained from local pharmacy or Health Dept. Verbalized acceptance and understanding.  Pneumococcal vaccine status: Up to date    Qualifies for Shingles Vaccine? Yes   Zostavax completed No   Shingrix Completed?: No.    Education has been provided regarding  the importance of this vaccine. Patient has been advised to call insurance company to determine out of pocket expense if they have not yet received this vaccine. Advised may also receive vaccine at local pharmacy or Health Dept. Verbalized acceptance and understanding.  Screening Tests Health Maintenance  Topic Date Due   Zoster Vaccines- Shingrix (1 of 2) 05/31/1997   DTaP/Tdap/Td (1 - Tdap) 07/24/2013   INFLUENZA VACCINE  10/14/2023 (Originally  02/14/2023)   Medicare Annual Wellness (AWV)  07/01/2024   Pneumonia Vaccine 18+ Years old  Completed   DEXA SCAN  Completed   Hepatitis C Screening  Completed   HPV VACCINES  Aged Out   MAMMOGRAM  Discontinued   Colonoscopy  Discontinued   COVID-19 Vaccine  Discontinued   Fecal DNA (Cologuard)  Discontinued    Health Maintenance  Health Maintenance Due  Topic Date Due   Zoster Vaccines- Shingrix (1 of 2) 05/31/1997   DTaP/Tdap/Td (1 - Tdap) 07/24/2013    Bone Density status: Completed 10/02/16. Results reflect: Bone density results: OSTEOPENIA. Repeat every   years.     Additional Screening:  Hepatitis C Screening: does qualify; Completed 09/27/16  Vision Screening: Recommended annual ophthalmology exams for early detection of glaucoma and other disorders of the eye. Is the patient up to date with their annual eye exam?  No  Who is the provider or what is the name of the office in which the patient attends annual eye exams? Patient deferred If pt is not established with a provider, would they like to be referred to a provider to establish care? No .   Dental Screening: Recommended annual dental exams for proper oral hygiene    Community Resource Referral / Chronic Care Management:  CRR required this visit?  No   CCM required this visit?  No     Plan:     I have personally reviewed and noted the following in the patient's chart:   Medical and social history Use of alcohol, tobacco or illicit drugs  Current medications and supplements including opioid prescriptions. Patient is not currently taking opioid prescriptions. Functional ability and status Nutritional status Physical activity Advanced directives List of other physicians Hospitalizations, surgeries, and ER visits in previous 12 months Vitals Screenings to include cognitive, depression, and falls Referrals and appointments  In addition, I have reviewed and discussed with patient certain preventive  protocols, quality metrics, and best practice recommendations. A written personalized care plan for preventive services as well as general preventive health recommendations were provided to patient.     Tillie Rung, LPN   16/04/9603   After Visit Summary: (MyChart) Due to this being a telephonic visit, the after visit summary with patients personalized plan was offered to patient via MyChart   Nurse Notes: None

## 2024-01-20 ENCOUNTER — Ambulatory Visit (INDEPENDENT_AMBULATORY_CARE_PROVIDER_SITE_OTHER): Payer: Medicare Other | Admitting: Internal Medicine

## 2024-01-20 ENCOUNTER — Other Ambulatory Visit: Payer: Self-pay | Admitting: Internal Medicine

## 2024-01-20 ENCOUNTER — Encounter: Payer: Self-pay | Admitting: Internal Medicine

## 2024-01-20 VITALS — BP 120/68 | HR 78 | Temp 98.4°F | Resp 16 | Ht 61.0 in | Wt 112.0 lb

## 2024-01-20 DIAGNOSIS — Z5320 Procedure and treatment not carried out because of patient's decision for unspecified reasons: Secondary | ICD-10-CM | POA: Diagnosis not present

## 2024-01-20 DIAGNOSIS — Z2821 Immunization not carried out because of patient refusal: Secondary | ICD-10-CM | POA: Diagnosis not present

## 2024-01-20 DIAGNOSIS — E785 Hyperlipidemia, unspecified: Secondary | ICD-10-CM | POA: Diagnosis not present

## 2024-01-20 DIAGNOSIS — Z0000 Encounter for general adult medical examination without abnormal findings: Secondary | ICD-10-CM | POA: Diagnosis not present

## 2024-01-20 LAB — COMPREHENSIVE METABOLIC PANEL WITH GFR
ALT: 14 U/L (ref 0–35)
AST: 21 U/L (ref 0–37)
Albumin: 4.6 g/dL (ref 3.5–5.2)
Alkaline Phosphatase: 35 U/L — ABNORMAL LOW (ref 39–117)
BUN: 14 mg/dL (ref 6–23)
CO2: 32 meq/L (ref 19–32)
Calcium: 9.9 mg/dL (ref 8.4–10.5)
Chloride: 101 meq/L (ref 96–112)
Creatinine, Ser: 0.97 mg/dL (ref 0.40–1.20)
GFR: 56.71 mL/min — ABNORMAL LOW (ref >60.00)
Glucose, Bld: 104 mg/dL — ABNORMAL HIGH (ref 70–99)
Potassium: 4.1 meq/L (ref 3.5–5.1)
Sodium: 141 meq/L (ref 135–145)
Total Bilirubin: 0.7 mg/dL (ref 0.2–1.2)
Total Protein: 6.9 g/dL (ref 6.0–8.3)

## 2024-01-20 LAB — LIPID PANEL
Cholesterol: 173 mg/dL (ref 0–200)
HDL: 75 mg/dL (ref >39.00)
LDL Cholesterol: 83 mg/dL (ref 0–99)
NonHDL: 97.59
Total CHOL/HDL Ratio: 2
Triglycerides: 74 mg/dL (ref 0.0–149.0)
VLDL: 14.8 mg/dL (ref 0.0–40.0)

## 2024-01-20 NOTE — Assessment & Plan Note (Signed)
 Here for CPX r  Other issues addressed today Hyperlipidemia: Has previously declined any medication.  Check FLP Anxiety, depression: Symptoms are controlled with Lexapro  Osteoporosis:  s/p 5 years of Fosamax  has decided not to recheck another DEXA Tobacco: Not ready to quit. Compliance: Patient has consistently declined a number of recommendations. Social: lives by herself, does not drive, does not have a computer, any communications will be via letter or telephone.  She has a good social supportive system RTC 1 year

## 2024-01-20 NOTE — Progress Notes (Signed)
 Subjective:    Patient ID: [name], female    DOB: 24-Mar-1946, 78 y.o.   MRN: [id number]  DOS:  01/20/2024 Type of visit - description: CPX  Here for CPX. Feeling well.  Has no concerns. Denies any falls.  Wt Readings from Last 3 Encounters:  01/20/24 112 lb (50.8 kg)  07/02/23 111 lb (50.3 kg)  01/16/23 111 lb 6 oz (50.5 kg)     Review of Systems  A 14 point review of systems is negative    Past Medical History:  Diagnosis Date   Anxiety    Anxiety and depression 07/23/2013   Depression    h/o early 70's- not currently   Osteoporosis     Past Surgical History:  Procedure Laterality Date   surgery     R arm and shoulder   TUBAL LIGATION     Social History   Social History Narrative   Lives by herself   Does not drive   [employer] education.   Lost husband [place])  05-2017: [place], [place]    Father lives in a ALF in [place], [address]; alive as of 01/2024    2 brothers, they are not local, Marcey call qd    Has a nephew in [place] (he drives patient)   Rowena is her best friend         Current Outpatient Medications  Medication Instructions   cholecalciferol (VITAMIN D ) 1,000 Units, Daily   escitalopram  (LEXAPRO ) 20 mg, Oral, Daily   Multiple Vitamin (MULTIVITAMIN WITH MINERALS) TABS tablet 1 tablet, Daily       Objective:   Physical Exam BP 120/68   Pulse 78   Temp 98.4 F (36.9 C) (Oral)   Resp 16   Ht 5' 1 (1.549 m)   Wt 112 lb (50.8 kg)   SpO2 96%   BMI 21.16 kg/m  General: Well developed, NAD, BMI noted Neck: No  thyromegaly  HEENT:  Normocephalic . Face symmetric, atraumatic Lungs:  CTA B Normal respiratory effort, no intercostal retractions, no accessory muscle use. Heart: RRR,  no murmur.  Abdomen:  Not distended, soft, non-tender. No rebound or rigidity.  Palpable nontender aorta.  No bruit Lower extremities: no pretibial edema bilaterally  Skin: Exposed areas without rash. Not pale. Not jaundice Neurologic:  alert & oriented  X3.  Speech normal, gait appropriate for age and unassisted Strength symmetric and appropriate for age.  Psych: Cognition and judgment appear intact.  Cooperative with normal attention span and concentration.  Behavior appropriate. No anxious or depressed appearing.     Assessment    ASSESSMENT Dyslipidemia: See OV 12/2021. Anxiety (fear of ridding a car), depression Osteoporosis t score -2.9 (09-2013). Started Fosamax  2015   Abnormal Lung Ca screening CT 09-2016, recheck CT 12-2016: findings resolved ; pulmonary rec annual CTs    PLAN  Here for CPX - Td 07-2013 -PNM shot 07-2013; prevnar--2016 - zostavax: 09-2013 Vaccines are recommended: COVID, Shingrix, RSV, PNM 20, flu shot every fall  ---No further PAPs, see comments from 2017  -- Breast -lung-colon cancer screening: Declines --H/o palpable abdominal aorta, neg  Ultrasound 2015 --- Labs: CMP CBC --- H-POA: Information provided.  Other issues addressed today Hyperlipidemia: Has previously declined any medication.  Check FLP Anxiety, depression: Symptoms are controlled with Lexapro  Osteoporosis:  s/p 5 years of Fosamax  has decided not to recheck another DEXA Tobacco: Not ready to quit. Compliance: Patient has consistently declined a number of recommendations. Social: lives by herself,  does not drive, does not have a computer, any communications will be via letter or telephone.  She has a good social supportive system RTC 1 year

## 2024-01-20 NOTE — Patient Instructions (Addendum)
  Vaccines to consider: COVID-vaccine Shingrix RSV Pneumonia shot (PNM 20) Flu shot every fall      GO TO THE LAB :  Get the blood work   Your results will be posted on MyChart with my comments  Next office visit for a physical exam in 1 year Please make an appointment before you leave today       Health Care Power of attorney (Also know as a  Living will or  Advance care planning documents)  If you already have a living will or healthcare power of attorney, is recommended you bring the copy to be scanned in your chart.   The document will be available to all the doctors you see in the system.  If you are over 77 y/o and don't have the document, please read:  Advance care planning is a process that supports adults in  understanding and sharing their preferences regarding future medical care.  The patient's preferences are recorded in documents called Advance Directives and the can be modified at any time while the patient is in full mental capacity.     More information at: [email or web address]

## 2024-01-20 NOTE — Assessment & Plan Note (Signed)
 Here for CPX - Td 07-2013 -PNM shot 07-2013; prevnar--2016 - zostavax: 09-2013 Vaccines are recommended: COVID, Shingrix, RSV, PNM 20, flu shot every fall  ---No further PAPs, see comments from 2017  -- Breast -lung-colon cancer screening: Declines --H/o palpable abdominal aorta, neg  Ultrasound 2015 --- Labs: CMP CBC --- H-POA: Information provided.

## 2024-01-22 ENCOUNTER — Ambulatory Visit: Payer: Self-pay | Admitting: Internal Medicine

## 2025-01-20 ENCOUNTER — Encounter: Admitting: Internal Medicine
